# Patient Record
Sex: Male | Born: 1955 | Race: White | Hispanic: No | Marital: Married | Smoking: Former smoker
Health system: Southern US, Community
[De-identification: ages and names within clinical notes are randomized; demographics above are authoritative.]

## PROBLEM LIST (undated history)

## (undated) DIAGNOSIS — K219 Gastro-esophageal reflux disease without esophagitis: Secondary | ICD-10-CM

## (undated) DIAGNOSIS — IMO0001 Reserved for inherently not codable concepts without codable children: Secondary | ICD-10-CM

## (undated) DIAGNOSIS — E785 Hyperlipidemia, unspecified: Secondary | ICD-10-CM

## (undated) DIAGNOSIS — C801 Malignant (primary) neoplasm, unspecified: Secondary | ICD-10-CM

## (undated) DIAGNOSIS — M1990 Unspecified osteoarthritis, unspecified site: Secondary | ICD-10-CM

## (undated) DIAGNOSIS — J449 Chronic obstructive pulmonary disease, unspecified: Secondary | ICD-10-CM

## (undated) DIAGNOSIS — I499 Cardiac arrhythmia, unspecified: Secondary | ICD-10-CM

## (undated) HISTORY — DX: Hyperlipidemia, unspecified: E78.5

## (undated) HISTORY — PX: HIP SURGERY: SHX245

## (undated) HISTORY — PX: SHOULDER SURGERY: SHX246

## (undated) HISTORY — PX: TOTAL KNEE ARTHROPLASTY: SHX125

## (undated) HISTORY — PX: TONSILLECTOMY: SUR1361

---

## 2005-04-19 ENCOUNTER — Encounter
Admission: RE | Admit: 2005-04-19 | Discharge: 2005-07-18 | Payer: Self-pay | Admitting: Physical Medicine & Rehabilitation

## 2005-04-20 ENCOUNTER — Ambulatory Visit: Payer: Self-pay | Admitting: Physical Medicine & Rehabilitation

## 2005-06-16 ENCOUNTER — Ambulatory Visit: Payer: Self-pay | Admitting: Physical Medicine & Rehabilitation

## 2005-08-09 ENCOUNTER — Encounter
Admission: RE | Admit: 2005-08-09 | Discharge: 2005-11-07 | Payer: Self-pay | Admitting: Physical Medicine & Rehabilitation

## 2005-08-09 ENCOUNTER — Ambulatory Visit: Payer: Self-pay | Admitting: Physical Medicine & Rehabilitation

## 2005-10-12 ENCOUNTER — Ambulatory Visit: Payer: Self-pay | Admitting: Physical Medicine & Rehabilitation

## 2005-12-13 ENCOUNTER — Encounter
Admission: RE | Admit: 2005-12-13 | Discharge: 2006-03-13 | Payer: Self-pay | Admitting: Physical Medicine & Rehabilitation

## 2005-12-13 ENCOUNTER — Ambulatory Visit: Payer: Self-pay | Admitting: Physical Medicine & Rehabilitation

## 2006-03-03 ENCOUNTER — Ambulatory Visit: Payer: Self-pay | Admitting: Physical Medicine & Rehabilitation

## 2006-03-06 ENCOUNTER — Encounter
Admission: RE | Admit: 2006-03-06 | Discharge: 2006-06-04 | Payer: Self-pay | Admitting: Physical Medicine & Rehabilitation

## 2006-05-24 ENCOUNTER — Ambulatory Visit: Payer: Self-pay | Admitting: Physical Medicine & Rehabilitation

## 2006-08-14 ENCOUNTER — Ambulatory Visit: Payer: Self-pay | Admitting: Physical Medicine & Rehabilitation

## 2006-08-14 ENCOUNTER — Encounter
Admission: RE | Admit: 2006-08-14 | Discharge: 2006-11-12 | Payer: Self-pay | Admitting: Physical Medicine & Rehabilitation

## 2006-11-03 ENCOUNTER — Ambulatory Visit: Payer: Self-pay | Admitting: Physical Medicine & Rehabilitation

## 2007-01-24 ENCOUNTER — Ambulatory Visit: Payer: Self-pay | Admitting: Physical Medicine & Rehabilitation

## 2007-01-24 ENCOUNTER — Encounter
Admission: RE | Admit: 2007-01-24 | Discharge: 2007-01-30 | Payer: Self-pay | Admitting: Physical Medicine & Rehabilitation

## 2007-05-01 ENCOUNTER — Ambulatory Visit: Payer: Self-pay | Admitting: Physical Medicine & Rehabilitation

## 2007-05-01 ENCOUNTER — Encounter
Admission: RE | Admit: 2007-05-01 | Discharge: 2007-05-28 | Payer: Self-pay | Admitting: Physical Medicine & Rehabilitation

## 2007-08-16 ENCOUNTER — Encounter
Admission: RE | Admit: 2007-08-16 | Discharge: 2007-08-20 | Payer: Self-pay | Admitting: Physical Medicine & Rehabilitation

## 2007-08-20 ENCOUNTER — Ambulatory Visit: Payer: Self-pay | Admitting: Physical Medicine & Rehabilitation

## 2007-11-23 ENCOUNTER — Encounter
Admission: RE | Admit: 2007-11-23 | Discharge: 2007-11-26 | Payer: Self-pay | Admitting: Physical Medicine & Rehabilitation

## 2007-11-26 ENCOUNTER — Ambulatory Visit: Payer: Self-pay | Admitting: Physical Medicine & Rehabilitation

## 2008-02-04 ENCOUNTER — Encounter
Admission: RE | Admit: 2008-02-04 | Discharge: 2008-02-04 | Payer: Self-pay | Admitting: Physical Medicine & Rehabilitation

## 2008-02-04 ENCOUNTER — Ambulatory Visit: Payer: Self-pay | Admitting: Physical Medicine & Rehabilitation

## 2008-04-10 ENCOUNTER — Ambulatory Visit: Payer: Self-pay | Admitting: Physical Medicine & Rehabilitation

## 2008-04-10 ENCOUNTER — Encounter
Admission: RE | Admit: 2008-04-10 | Discharge: 2008-04-10 | Payer: Self-pay | Admitting: Physical Medicine & Rehabilitation

## 2008-05-01 ENCOUNTER — Encounter
Admission: RE | Admit: 2008-05-01 | Discharge: 2008-06-27 | Payer: Self-pay | Admitting: Physical Medicine & Rehabilitation

## 2008-05-02 ENCOUNTER — Ambulatory Visit: Payer: Self-pay | Admitting: Physical Medicine & Rehabilitation

## 2008-06-27 ENCOUNTER — Ambulatory Visit: Payer: Self-pay | Admitting: Physical Medicine & Rehabilitation

## 2008-08-13 ENCOUNTER — Encounter
Admission: RE | Admit: 2008-08-13 | Discharge: 2008-11-11 | Payer: Self-pay | Admitting: Physical Medicine & Rehabilitation

## 2008-08-22 ENCOUNTER — Ambulatory Visit: Payer: Self-pay | Admitting: Physical Medicine & Rehabilitation

## 2008-09-24 ENCOUNTER — Ambulatory Visit: Payer: Self-pay | Admitting: Physical Medicine & Rehabilitation

## 2008-10-24 ENCOUNTER — Ambulatory Visit: Payer: Self-pay | Admitting: Physical Medicine & Rehabilitation

## 2008-12-19 ENCOUNTER — Encounter
Admission: RE | Admit: 2008-12-19 | Discharge: 2009-02-25 | Payer: Self-pay | Admitting: Physical Medicine & Rehabilitation

## 2008-12-19 ENCOUNTER — Ambulatory Visit: Payer: Self-pay | Admitting: Physical Medicine & Rehabilitation

## 2009-02-13 ENCOUNTER — Ambulatory Visit: Payer: Self-pay | Admitting: Physical Medicine & Rehabilitation

## 2009-04-07 ENCOUNTER — Encounter
Admission: RE | Admit: 2009-04-07 | Discharge: 2009-06-10 | Payer: Self-pay | Admitting: Physical Medicine & Rehabilitation

## 2009-04-10 ENCOUNTER — Ambulatory Visit: Payer: Self-pay | Admitting: Physical Medicine & Rehabilitation

## 2009-06-11 ENCOUNTER — Encounter
Admission: RE | Admit: 2009-06-11 | Discharge: 2009-06-11 | Payer: Self-pay | Admitting: Physical Medicine & Rehabilitation

## 2009-06-11 ENCOUNTER — Ambulatory Visit: Payer: Self-pay | Admitting: Physical Medicine & Rehabilitation

## 2009-08-05 ENCOUNTER — Encounter
Admission: RE | Admit: 2009-08-05 | Discharge: 2009-11-03 | Payer: Self-pay | Admitting: Physical Medicine & Rehabilitation

## 2009-08-05 ENCOUNTER — Ambulatory Visit: Payer: Self-pay | Admitting: Physical Medicine & Rehabilitation

## 2009-09-18 ENCOUNTER — Ambulatory Visit: Payer: Self-pay | Admitting: Physical Medicine & Rehabilitation

## 2009-10-15 ENCOUNTER — Ambulatory Visit: Payer: Self-pay | Admitting: Physical Medicine & Rehabilitation

## 2009-11-19 ENCOUNTER — Encounter
Admission: RE | Admit: 2009-11-19 | Discharge: 2010-02-11 | Payer: Self-pay | Source: Home / Self Care | Attending: Physical Medicine & Rehabilitation | Admitting: Physical Medicine & Rehabilitation

## 2009-12-18 ENCOUNTER — Ambulatory Visit: Payer: Self-pay | Admitting: Physical Medicine & Rehabilitation

## 2010-02-11 ENCOUNTER — Encounter
Admission: RE | Admit: 2010-02-11 | Discharge: 2010-02-18 | Payer: Self-pay | Source: Home / Self Care | Attending: Physical Medicine & Rehabilitation | Admitting: Physical Medicine & Rehabilitation

## 2010-02-18 ENCOUNTER — Ambulatory Visit: Payer: Self-pay | Admitting: Physical Medicine & Rehabilitation

## 2010-04-14 ENCOUNTER — Encounter: Payer: Self-pay | Attending: Physical Medicine & Rehabilitation

## 2010-04-14 ENCOUNTER — Ambulatory Visit: Payer: Self-pay | Admitting: Physical Medicine & Rehabilitation

## 2010-04-14 DIAGNOSIS — G8929 Other chronic pain: Secondary | ICD-10-CM | POA: Insufficient documentation

## 2010-04-14 DIAGNOSIS — Z96649 Presence of unspecified artificial hip joint: Secondary | ICD-10-CM | POA: Insufficient documentation

## 2010-04-14 DIAGNOSIS — M1610 Unilateral primary osteoarthritis, unspecified hip: Secondary | ICD-10-CM

## 2010-04-14 DIAGNOSIS — M479 Spondylosis, unspecified: Secondary | ICD-10-CM | POA: Insufficient documentation

## 2010-04-14 DIAGNOSIS — M19019 Primary osteoarthritis, unspecified shoulder: Secondary | ICD-10-CM | POA: Insufficient documentation

## 2010-04-14 DIAGNOSIS — M47817 Spondylosis without myelopathy or radiculopathy, lumbosacral region: Secondary | ICD-10-CM

## 2010-04-14 DIAGNOSIS — M169 Osteoarthritis of hip, unspecified: Secondary | ICD-10-CM | POA: Insufficient documentation

## 2010-04-14 DIAGNOSIS — M1710 Unilateral primary osteoarthritis, unspecified knee: Secondary | ICD-10-CM

## 2010-06-03 ENCOUNTER — Encounter: Payer: Self-pay | Attending: Physical Medicine & Rehabilitation

## 2010-06-03 DIAGNOSIS — M1610 Unilateral primary osteoarthritis, unspecified hip: Secondary | ICD-10-CM | POA: Insufficient documentation

## 2010-06-03 DIAGNOSIS — M1710 Unilateral primary osteoarthritis, unspecified knee: Secondary | ICD-10-CM | POA: Insufficient documentation

## 2010-06-03 DIAGNOSIS — M545 Low back pain, unspecified: Secondary | ICD-10-CM | POA: Insufficient documentation

## 2010-06-03 DIAGNOSIS — M25569 Pain in unspecified knee: Secondary | ICD-10-CM | POA: Insufficient documentation

## 2010-06-03 DIAGNOSIS — M169 Osteoarthritis of hip, unspecified: Secondary | ICD-10-CM | POA: Insufficient documentation

## 2010-06-03 DIAGNOSIS — M25559 Pain in unspecified hip: Secondary | ICD-10-CM | POA: Insufficient documentation

## 2010-06-03 DIAGNOSIS — Z96649 Presence of unspecified artificial hip joint: Secondary | ICD-10-CM | POA: Insufficient documentation

## 2010-06-03 DIAGNOSIS — M47817 Spondylosis without myelopathy or radiculopathy, lumbosacral region: Secondary | ICD-10-CM

## 2010-06-03 DIAGNOSIS — Z79899 Other long term (current) drug therapy: Secondary | ICD-10-CM | POA: Insufficient documentation

## 2010-06-03 DIAGNOSIS — M19019 Primary osteoarthritis, unspecified shoulder: Secondary | ICD-10-CM | POA: Insufficient documentation

## 2010-07-13 NOTE — Assessment & Plan Note (Signed)
[name] returns to clinic today for followup evaluation.  Overall  he is doing well.  He does need a refill on several of his medicines in  the office today.  He is just running out today from last prescription  supplied.   REVIEW OF SYSTEMS:  Noncontributory.   MEDICATIONS:  1. Xanax 0.5 mg b.i.d. p.r.n. (approximately 2 per day).  2. Lortab 10/500, one tablet t.i.d. p.r.n.  3. Oxycodone continuous release 80 mg q.12h. (trade name).  4. Skelaxin 800 mg daily p.r.n.  5. Imitrex p.r.n. (rarely used).  6. Aspirin 81 mg daily.  7. Lipitor 40 mg daily.  8. Omeprazole 20 mg daily.  9. R-Tanna one tablet b.i.d. p.r.n.   PHYSICAL EXAM:  A well-appearing adult male in mild to no acute  discomfort.  Blood pressure is 141/74, the pulse is 97, respiratory rate  18 and O2 saturation 95% on room air.  He ambulates with a single point  can.  His bilateral upper and lower extremity strength are 5/5.   IMPRESSION:  Chronic left hip, low back and bilateral knee pain with  reported muscle fatigue and myalgia.   In the office today we did refill the patient's OxyContin continuous  release along with his R-Tanna, Skelaxin, Xanax and Lortab, each as of  today, November 06, 2006.  The patient continues to be compliant with  medication with good analgesic effect being delivered by his medicines.  Will plan on seeing the patient in followup in this office in  approximately 3 months' time with refills as necessary prior to that  appointment.           ______________________________  Ellwood Dense, M.D.     DC/MedQ  D:  11/06/2006 12:00:23  T:  11/06/2006 15:23:37  Job #:  [id number]

## 2010-07-13 NOTE — Assessment & Plan Note (Signed)
FOLLOWUP EVALUATION   SUBJECTIVE:  Scott Jefferson returns to clinic today for followup  evaluation.  He reports that he is getting fairly good relief with the  OxyContin.  He uses 80 mg twice a day.  It generally lasts 6 to 8 hours.  He uses his Lortab between the b.i.d. dosing on the OxyContin.  He does  need a refill on the Lortab along with his Xanax in the office today.  He plans to have an evaluation with Dr. Charlann Boxer, a local orthopedist, for  his right knee.  He also plans to discuss problems that he is having  with his right shoulder with the orthopedist when he sees him for the  first time.  The patient does request a refill on his R-Tanna.   REVIEW OF SYSTEMS:  Noncontributory.   MEDICATIONS:  1. Xanax 0.5 mg b.i.d. p.r.n.  2. Lortab 10/500 one tablet t.i.d. p.r.n.  3. Oxycodone continuous release 80 mg q 12 hours (trade name).  4. Skelaxin 800 mg q day p.r.n.  5. Imitrex p.r.n.  6. Aspirin 81 mgdaily.  7. Lipitor 40 mg daily.  8. Omeprazole 20 mg daily.  9. R-Tanna 1 tablet b.i.d. p.r.n.   PHYSICAL EXAMINATION:  GENERAL:  Well-appearing, middle-aged adult male  in mild, acute discomfort involving his right knee and right shoulder.  VITAL SIGNS:  Blood pressure is 142/75 with pulse 94,  respiratory rate  18 and O2 saturation 96 percent on room air.  EXTREMITIES: Range of motion was decreased in his right upper extremity,  especially proximally.  He has 4+/5 strength distally.  Lower extremity  exam showed 4+/5 strength throughout.  He ambulates with a single-point  cane.   IMPRESSION:  Chronic left hip, low back and bilateral knee pain with  reported muscle fatigue and myalgia.   In the office today, we did refill the patient's R-Tanna along with his  OxyContin, Xanax and Lortab, each as of February 01, 2007.  We will plan  on seeing the patient in followup in approximately 3 to 4 months' time  with refills prior to that appointment as necessary.     ______________________________  Ellwood Dense, M.D.     DC/MedQ  D:  01/29/2007 12:14:29  T:  01/29/2007 13:21:49  Job #:  [id number]

## 2010-07-13 NOTE — Assessment & Plan Note (Signed)
Scott Jefferson returns to clinic today for followup evaluation.  He  reports that he is doing well overall.  He continues to use his  OxyContin continuous release 80 mg twice a day along with his p.r.n.  Lortab and Xanax.  He also takes omeprazole 20 mg daily along with his  other medicines.   The patient is not using the Skelaxin to any significant degree at this  time and has a sufficient supply.   MEDICATIONS:  1. Xanax 0.5 mg b.i.d. p.r.n. (approximately 2 per day).  2. Lortab 10/500 one tablet t.i.d. p.r.n.  3. Oxycodone continuous release 80 mg q.12 h. (trade name).  4. Skelaxin 800 mg daily p.r.n.  5. Imitrex p.r.n. (rarely used).  6. Aspirin 81 mg daily.  7. Lipitor 40 mg daily.  8. Omeprazole 20 mg daily.   REVIEW OF SYSTEMS:  Noncontributory.   PHYSICAL EXAMINATION:  Well-appearing adult male in no acute discomfort.  Blood pressure 143/82 with a pulse of 93, respiratory rate 17 and O2  saturation 97% on room air.  He ambulates with a single-point cane.  His bilateral upper and lower extremity strength is 5/5.   IMPRESSION:  1. Chronic left hip, low back, and bilateral knee pain with reported      muscle fatigue and myalgia.  2. In the office today we did refill the patient's OxyContin      continuous release 80 mg q.12 h. along with his Lortab and Xanax.      We will plan on seeing the patient in followup in approximately 3      months' time with refill of medication prior to that date if      necessary.  The patient continues to get good analgesic effect      without signs of diversion or significant side effects.           ______________________________  Ellwood Dense, M.D.     DC/MedQ  D:  08/18/2006 15:10:02  T:  08/18/2006 19:59:55  Job #:  [id number]

## 2010-07-13 NOTE — Assessment & Plan Note (Signed)
Scott Jefferson returns to clinic today for followup evaluation.  He  reports today that he is getting good relief from his OxyContin, Lortab,  Xanax, and Skelaxin.  He does need refills on each of those over the  next few days.  He reports that he is under code red at the present  time.  His wife is recovering from a back surgery, and he is hopeful  that she will be able to return to work and then cover him with her  insurance.  In the meantime, he is at risk of having no insurance after  January 2010.  We will try to adjust the medications  for him as best  possible in the office today.   REVIEW OF SYSTEMS:  Noncontributory.   MEDICATIONS:  1. Xanax 0.5 mg b.i.d. p.r.n.  2. Lortab 10/500 1 tablet t.i.d. p.r.n.  3. OxyContin 10 continuous release 80 mg t.i.d. (trade name).  4. Skelaxin 800 mg q. day. p.r.n.  5. Imitrex p.r.n.  6. Aspirin 81 mg q. day.  7. Lipitor 40 mg q. day.  8. Omeprazole 20 mg q. day.  9. R-Tanna 1 tablet b.i.d. p.r.n.   PHYSICAL EXAMINATION:  GENERAL:  Well-appearing, middle-aged adult male  in not acute discomfort.  VITAL SIGNS:  Blood pressure 147/70 with pulse of 94, respiratory rate  18, and O2 saturation at 96% on room air.  He has 4+/5 strength  throughout the bilateral upper and lower extremities.  He ambulates with  a single-point gait.   IMPRESSION:  1. Chronic left hip, low back, and bilateral knee pain with reported      muscle fatigue and myalgia.  2. In the office today, we did refill the patient's Xanax, Lortab,      OxyContin, and Skelaxin each as of February 11, 2008.  3. We plan on seeing the patient in followup in this office in      approximately 4-5 months' time with refills prior to that      appointment as covered by his insurance in the future.           ______________________________  Ellwood Dense, M.D.     DC/MedQ  D:  02/04/2008 13:10:08  T:  02/05/2008 07:30:21  Job #:  [id number]

## 2010-07-13 NOTE — Assessment & Plan Note (Signed)
Scott Jefferson returns to clinic today for followup evaluation.  During  the last clinic visit, May 28, 2007, we had increased his OxyContin  continuous release to 80 mg 3 times per day.  He reports that he is  getting much better relief with that present dosage.  He also has Lortab  available on an as needed basis 3 times a day.  He needs a refill on the  OxyContin and Lortab along with the Xanax, but has a sufficient supply  of Skelaxin.   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   REVIEW OF SYSTEMS:  Noncontributory.   MEDICATIONS:  1. Xanax 0.5 mg b.i.d. p.r.n.  2. Lortab 10/500 one tablet t.i.d. p.r.n.  3. OxyContin continuous release 80 mg t.i.d. (trade name).  4. Skelaxin 800 mg daily p.r.n.  5. Imitrex p.r.n.  6. Aspirin 81 mg.  7. Lipitor 40 mg.  8. Omeprazole 20 mg.  9. R-Tanna 1 tablet b.i.d. p.r.n.   PHYSICAL EXAMINATION:  GENERAL:  Well appearing middle-aged adult male,  in mild to no acute discomfort.  VITAL SIGNS:  Blood pressure 131/81 with a pulse of 82, respiratory rate  18, and O2 saturation 95% on room air.  EXTREMITIES:  He ambulates with a single point cane.  He has 4+/5  strength throughout the bilateral lower extremities and 4+/5 strength  throughout the bilateral upper extremities.   IMPRESSION:  Chronic left hip, low back, and bilateral knee pain with  reported muscle fatigue and myalgia.   In the office today, we did refill the patient's Xanax along with his  OxyContin continuous release and Lortab.  He continues to take  medications as prescribed without adverse side effects or signs of  diversion.  We will plan on seeing him in followup in approximately 3  months' time.           ______________________________  Ellwood Dense, M.D.     DC/MedQ  D:  08/20/2007  14:40:05  T:  08/21/2007 04:54:09  Job #:  [id number]

## 2010-07-13 NOTE — Assessment & Plan Note (Signed)
Mr. Reihl returns to clinic today for followup evaluation.  He has  been using his OxyContin CR 80 mg twice a day.  He generally uses the  first tablet in the morning and the second tablet approximately 2  o'clock in the afternoon.  That gives him better relief through the  first part of the day but he reports that he suffers through the latter  portion of the day as he has already used up his 2 tablets.  He does use  the Lortab approximately 3 per day and tries to use that later in the  day but still gets inadequate relief.  When he does use his OxyContin in  the morning and the early afternoon, he does get good relief.  He tries  to remain active and is raising a small garden.  He does need refills on  numerous medicines in the office today.  He is using the Skelaxin only  on an as-needed basis and does not need a refill on that medication.   REVIEW OF SYSTEMS:  Noncontributory.   MEDICATIONS:  1. Xanax 0.5 mg 1 tablet b.i.d. p.r.n.  2. Lortab 10/500 one tablet t.i.d. p.r.n.  3. OxyContin continuous release 80 mg every 12 hours (trade name).  4. Skelaxin 800 mg daily p.r.n.  5. Imitrex p.r.n.  6. Aspirin 81 mg daily.  7. Lipitor 40 mg daily.  8. Omeprazole 20 mg daily.  9. R-Tanna 1 tablet b.i.d. p.r.n.   PHYSICAL EXAM:  Well-appearing, middle-aged adult male in mild acute  discomfort.  Blood pressure 158/85 with a pulse of 103, respiratory rate  18, and O2 saturation 96% on room air.  He has 4+/5 strength throughout  the lower extremities.  He ambulates with a single-point cane.  Upper  extremity strength was 4+/5.   IMPRESSION:  Chronic left hip, low back, and bilateral knee pain with  reported muscle fatigue and myalgia.   In the office today, we did adjust the patient's OxyContin continuous  release to 80 mg 3 times a day to give him better relief throughout the  latter part of the day.  We also refilled his Xanax along with this  Lortab in the office today.  The  OxyContin was trade name necessary.  We  will plan on seeing the patient in followup in approximately 3 months  time with refills prior to that appointment as necessary.           ______________________________  Ellwood Dense, M.D.     DC/MedQ  D:  05/28/2007 15:00:31  T:  05/28/2007 15:41:58  Job #:  [id number]

## 2010-07-13 NOTE — Assessment & Plan Note (Signed)
HISTORY OF PRESENT ILLNESS:  Scott Jefferson is back regarding his chronic  arthritides.  He has received the new DonJoy  brace which seems to be  working for him and not slipping.  It provides better support and  offloading of his knee.  He rates his pain 4-5/10.  He describes his  pain is constant.  Pain interferes with general activity, relations with  others, enjoyment of life on a moderate level.  Sleep is fair.   REVIEW OF SYSTEMS:  Notable for the above.  Full-14 point review is in  the written health and history section of the chart.   SOCIAL HISTORY:  Noted above.  He is living in [facility].   PHYSICAL EXAMINATION:  VITAL SIGNS:  Blood pressure is 152/82, pulse is  102, respiratory rate 80, he is sating 98% on room air.  GENERAL:  The patient is pleasant, alert, and oriented x3.  EXTREMITIES:  He continues to walk with antalgia, but seems to be  bearing weight a bit better on the left side today.  His strength is 4/5  at the knees with inhibition.  Other strength at the hips, feet, upper  extremities is 4+-5/5.  He has more pain with flexion than extension on  the back movement today.  HEART:  Regular rhythm slightly tachycardiac.  CHEST:  Clear.  ABDOMEN:  Soft, nontender.   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   PLAN:  1. We will refill the OxyContin 80 mg q.8 h as he is fairly stable      with this #90.  Also, Lortab 10/500 q.8 h p.r.n.  He is given a      second refill for next month.  2. I will see him back in 2 months for a followup.      Ranelle Oyster, M.D.  Electronically Signed     ZTS/MedQ  D:  10/24/2008 11:11:58  T:  10/25/2008 02:31:07  Job #:  [id number]

## 2010-07-13 NOTE — Assessment & Plan Note (Signed)
Mr. Edgerly is back regarding his multiple arthritides.  The patient  has been generally stable with his knee pain.  He brought his braces  with him today and most of them are in disrepair.  He has a larger  DonJoy brace which seemed to be somewhat functional.  His pain remains 4-  5/10.  Pain is constant.  Sleep is fair.  His back has been acting up a  bit more lately.   REVIEW OF SYSTEMS:  Notable for the above.  Full 14-point review is in  the written health and history section of the chart.   SOCIAL HISTORY:  Unchanged.  He still lives in [place].   PHYSICAL EXAMINATION:  VITAL SIGNS:  Blood pressure is 154/82, pulse is  102, respiratory rate 18, sating 96% on room air.  GENERAL:  The patient is pleasant, alert and oriented x3.  EXTREMITIES:  He has some antalgia, more on the left than right knee  today.  Knee strength is generally 4+/5 with pain inhibition and  weakness.  He had pain with flexion of the hips more than extension.  BACK:  Somewhat tender to range of motion especially with flexion  activity.  HEART:  Regular.  CHEST:  Clear.  ABDOMEN:  Soft, nontender.   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   PLAN:  1. Continue with OxyContin 80 mg q.8 h, #90; Lortab 10/500 q.8 h      p.r.n.  I gave him a second month of refills.  2. We will send him to the orthotist to assess him for replacement      parts for his knee braces.  3. We will see him back with nursing in 2 months and with me in 4      months' time.      Ranelle Oyster, M.D.  Electronically Signed     ZTS/MedQ  D:  06/27/2008 13:32:55  T:  06/28/2008 03:11:07  Job #:  [id number]

## 2010-07-13 NOTE — Assessment & Plan Note (Signed)
[name] returns to clinic today for followup evaluation.  He  reports that overall he is doing well on his OxyContin continues release  use 3 times per day and his Lortab use approximately 3 times per day.  He does need a refill on his Skelaxin but has sufficient supply of the  other 2 medications.   REVIEW OF SYSTEMS:  Noncontributory.   MEDICATIONS:  1. Xanax 0.5 mg b.i.d. p.r.n.  2. Lortab 10/500 one tablet t.i.d. p.r.n.  3. OxyContin continuous release 80 mg t.i.d. (trade name).  4. Skelaxin 800 mg daily p.r.n.  5. Imitrex p.r.n.  6. Aspirin 81 mg daily.  7. Lipitor 40 mg daily.  8. Omeprazole 20 mg daily.  9. R-Tanna 1 tablet b.i.d. p.r.n.   PHYSICAL EXAMINATION:  GENERAL:  Well-appearing, middle-aged adult male  in mild-to-no acute discomfort.  VITAL SIGNS:  Blood pressure 137/67 with a pulse of 104, respiratory  rate 18, and O2 saturation 97% on room air.  She has 4+/5 strength  throughout the bilateral upper and lower extremities.   IMPRESSION:  Chronic left hip, low back, and bilateral knee pain with  reported muscle fatigue and myalgia.   In the office today, no refill on his Lortab or OxyContin is necessary.  We did refill his Skelaxin at 800 mg daily p.r.n. a total of 30 with 3  refills.  We will plan on seeing the patient in followup in  approximately early December as he has plan to have the insurance change  towards the end of the year.           ______________________________  Ellwood Dense, M.D.     DC/MedQ  D:  11/26/2007 15:04:51  T:  11/27/2007 04:58:59  Job #:  [id number]

## 2010-07-13 NOTE — Assessment & Plan Note (Signed)
Scott Jefferson is back regarding his multiple arthridites.  He has been  seen by Dr. Thomasena Edis for some time with pain management.  The patient has  been on OxyContin 80 mg t.i.d. with Lortab 10/500 one t.i.d. p.r.n..  He  has had 2 hip replacement surgeries.  Left one apparently still gives  him a big problem, and his right hip is doing fairly well.  He has had  recent arthroscopic surgery by Dr. Ranell Patrick.  He has stone recovery phase  there.  He has severe knee pain and osteoarthritis and needs a knee  replacement, but is hesitant to pursue these due to his prior  experiences with the hips.  He rates his pain a 4-5/10 described as  constant.  Pain interferes with general activity, relationship with  others, enjoyment of life on a mild-to-moderate level.  Sleep remains  poor.   REVIEW OF SYSTEMS:  Notable for the above.  He denies any new issues and  full review is in the health and history section of the chart.  The  patient's Oswestry score 60% today.   SOCIAL HISTORY:  Unchanged today.  He remains disabled and is applying  for long-term disability.   PHYSICAL EXAMINATION:  VITAL SIGNS:  Blood pressure is 151/75, pulses  are 110, respiratory rate 18, he is sating 96% on room air.  GENERAL:  The patient is generally alert and pleasant.  He walks with  antalgia on either legs with significant valgus deformities at each  knee.  He has more antalgia on the left and right.  Knees are strong at  4+ 5/5 with some pain inhibition noted.  He has pain in hips with  flexion more than extension today.  He uses a straight cane for balance.  There are no focal sensory issues in either legs and reflexes are 1+.  Right shoulder is limited with range of motion and he is sensitive to  move it due to his postsurgical precautions at this point.  SKIN:  Generally intact, warm with good color and pulses today.  Cognitively, he is appropriate.  CRANIAL NERVE:  Normal.  HEART:  Remains tachycardic.  CHEST:   Clear.  ABDOMEN:  Soft, nontender.   ASSESSMENT:  Chronic degenerative joint disease involving the right  shoulder, both hips and bilateral knees setting of low back pain.   PLAN:  1. We will continue with his current regimen for now of OxyContin 80      mg q.8 h with Lortab 10/500 one q.8 h p.r.n.  UDS was checked last      month, which was essentially consistent.  2. I think he would do well with bilateral knee replacements.  I urged      him to discuss this further with Dr. Charlann Boxer who has been in contact      with.  I believe he has an appointment there next month.  I did      give the patient a second fill of his narcotics as he is suffering      with financial hardship at this point and lives in the [place] area      and has quite a drive here.  I will see him back in about 2 months'      time for followup.      Ranelle Oyster, M.D.  Electronically Signed     ZTS/MedQ  D:  05/02/2008 13:55:08  T:  05/03/2008 05:43:57  Job #:  [id number]

## 2010-07-16 NOTE — Group Therapy Note (Signed)
MEDICAL RECORD NUMBER:  [id number].   REFERRING PHYSICIAN:  Dr. Ane Payment, [facility] [place], [place].   PURPOSE OF EVALUATION:  Evaluate and treat chronic back and hip/knee pain.   HISTORY OF PRESENT ILLNESS:  [name] is a 55 year old adult male  referred to this office by Dr. Jearld Shines office for chronic pain management.  Apparently, Dr. Ane Payment has some problems with his medical license, and he is  not seeing patient's at this time. The patient had been receiving his pain  medicines from Dr. Ane Payment, but that is no longer possible, and he was  referred here for continuation of those medicines.   The patient reports that he has had chronic pain of his hips, initially  starting on the left side when he had a left total hip replacement in 2002.  He was on pain medicines after that initial left hip replacement and then  subsequently went on to have a right total hip replacement in 2004. He  reports that during the 2004 surgery at [facility] he was later informed  that they had washed the instruments for the hardware with some type of  hydraulic fluid reportedly from an elevator shaft. The patient has tried to  look into what effects that may have had, but it is unsure exactly what  effect it may have had at this point. He has had severe muscle ache along  with pain, mostly of his left hip at this time although the incident with  hydraulic fluid involved his right hip surgery. He reports pain all over his  body, especially in his low back. He has a history of bilateral knee pain  and wears braces on his knees at the present time. He also reports a vague  clenching pain of his hand when he first wakes up in the morning although  that seems to ease. He reports that he has had a history of low back pain  although there is no documentation of any studies as best I can tell.   The patient has been getting his OxyContin and hydrocodone medications from  Dr. Ane Payment in [place].  Apparently, he has had no medicines since the end of  December to early January and has tried to use the medicine as sparingly as  possible to prolong the course of treatment. He has now been off his  OxyContin for several weeks and experienced flu-like symptoms when he came  off of that. He had been taking the hydrocodone until he ran out a few days  ago. He also takes Xanax and needs a refill on that as that was also filled  by Dr. Ane Payment.   The patient apparently had seen a rheumatologist by Dr. Francine Graven in July of  2005. Dr. Francine Graven could not find any evidence of rheumatoid disease. He had  done numerous blood tests including TSH, rheumatoid factor and antinuclear  antibiotics which were also within normal limits. His complex metabolic  panel only came back for an elevated glucose although it is unclear if that  was a fasting level. All other blood work was unremarkable. Dr. Francine Graven  could not come up on any known problems related to the hydraulic fluid.   PAST MEDICAL HISTORY:  1.  Gastroesophageal reflux disease.  2.  History of migraines.  3.  Dyslipidemia.  4.  Bilateral knee osteoarthritis with braces and prior meniscectomy of the      right knee.  5.  Irritable bowel syndrome with questionable COPD.  6.  History  of panic attacks.   ALLERGIES:  No known drug allergies.   FAMILY HISTORY:  Parents are still living and are cancer survivors.   SOCIAL HISTORY:  The patient is married with no children. He has a high  school education and was in the [employer] for a period of time in the early  70s. He has been on disability payments since 2001 and previously worked for  the railroad. He admits to occasional beer use, but he reports that he quit  smoking greater than 10 years ago. He had smoked 20 years, 2 packs per day  prior to quitting.   PHYSICAL EXAMINATION:  GENERAL:  Reasonably fit adult male seated in a  regular chair. He has braces on his bilateral knees and uses a  single-point  cane for ambulation. Height was 6 foot, weight 200 pounds.  VITAL SIGNS:  Blood pressure 133/75 with a pulse of 108, respiratory rate 16  and O2 saturation 96% on room air.   The patient ambulates with an antalgic gait especially on the right side.  Lumbar range of motion was decreased in all planes in the stand position.  Upper extremity range of motion was within functional limits. Upper  extremity exam showed 5/5 strength throughout. Bulk and tone were normal,  and reflexes were 2+ and symmetrical. Sensation was intact to light touch  throughout the bilateral upper extremities.   Lower extremity exam showed braces in place on his bilateral knees. Strength  in his lower extremities was 4+/5. Sensation was intact to light touch  throughout his bilateral lower extremities, and reflexes were 2+ and  symmetrical.   MEDICATIONS:  1.  Xanax 0.5 mg b.i.d. p.r.n.  2.  Lortab 10/500 one tablet t.i.d. p.r.n.  3.  Advair 100/50 daily.  4.  Lipitor 40 mg daily.  5.  Aciphex 20 mg daily.  6.  Aspirin 81 mg daily.  7.  Imitrex p.r.n.  8.  __________ p.r.n.  9.  Ketoconazole lotion 2% p.r.n.  10. Skelaxin 800 mg daily p.r.n.  11. OxyContin SR 80 mg q.12h.   IMPRESSION:  1.  Chronic left hip, low back and bilateral knee pain.  2.  Reported muscle fatigue and myalgias, diffuse.   At the present time, the patient had been gaining good relief with the pain  medicines used by Dr. Ane Payment over the past several months to years.  Unfortunately, Dr. Ane Payment is not available to fill those. We are asked to  fill in at this time. The patient had been getting good relief and was  actually able to plant a garden last year. He has had a significant decline  since he has been off of his pain medicines with a flare of his left back,  left hip, and bilateral knee pain.   We have refilled both the Xanax along with his hydrocodone and his OxyContin in the office today. He understands that he  needs to comply with all of our  regulations and call in for refills at least five business days prior to  refill being needed. He understands he needs to take the medication only as  prescribed and be compliant with all rules. We will plan on seeing the  patient in followup in this office in approximately two months' time with  refill of medication at the one month period.           ______________________________  Ellwood Dense, M.D.     DC/MedQ  D:  04/20/2005 13:24:31  T:  04/21/2005 10:19:05  Job #:  [id number]

## 2010-07-28 ENCOUNTER — Encounter: Payer: Self-pay | Attending: Physical Medicine & Rehabilitation | Admitting: Physical Medicine & Rehabilitation

## 2010-07-28 DIAGNOSIS — M25569 Pain in unspecified knee: Secondary | ICD-10-CM | POA: Insufficient documentation

## 2010-07-28 DIAGNOSIS — Z79899 Other long term (current) drug therapy: Secondary | ICD-10-CM | POA: Insufficient documentation

## 2010-07-28 DIAGNOSIS — M1610 Unilateral primary osteoarthritis, unspecified hip: Secondary | ICD-10-CM | POA: Insufficient documentation

## 2010-07-28 DIAGNOSIS — M47817 Spondylosis without myelopathy or radiculopathy, lumbosacral region: Secondary | ICD-10-CM

## 2010-07-28 DIAGNOSIS — M1710 Unilateral primary osteoarthritis, unspecified knee: Secondary | ICD-10-CM

## 2010-07-28 DIAGNOSIS — M479 Spondylosis, unspecified: Secondary | ICD-10-CM | POA: Insufficient documentation

## 2010-07-28 DIAGNOSIS — M19019 Primary osteoarthritis, unspecified shoulder: Secondary | ICD-10-CM | POA: Insufficient documentation

## 2010-07-28 DIAGNOSIS — M25559 Pain in unspecified hip: Secondary | ICD-10-CM | POA: Insufficient documentation

## 2010-07-28 DIAGNOSIS — M169 Osteoarthritis of hip, unspecified: Secondary | ICD-10-CM | POA: Insufficient documentation

## 2010-07-28 DIAGNOSIS — Z96649 Presence of unspecified artificial hip joint: Secondary | ICD-10-CM | POA: Insufficient documentation

## 2010-07-29 NOTE — Assessment & Plan Note (Signed)
Scott Jefferson is back regarding his chronic knee and hip pain.  It generally has been fairly stable.  He is suffering from a recent GI infection but otherwise doing well.  He is managing with methadone and hydrocodone as dosed.  REVIEW OF SYSTEMS:  Notable for the above.  Full 12-point review is in the written health and history section of the chart.  SOCIAL HISTORY:  The patient lives with his wife in [place] and no changes noted.  PHYSICAL EXAMINATION:  VITAL SIGNS:  Blood pressure is 155/74, pulse is 94, respiratory rate 18, and he is saturating 97% on room air. GENERAL:  The patient is pleasant, alert, and oriented x3. MUSCULOSKELETAL:  He has antalgia on the right more than left lower extremity again.  He favors bilateral knees, especially in stance phase. Strength remains 4-5/5 with pain inhibition in both legs noted. HEART:  Regular. CHEST:  Clear. ABDOMEN:  Soft and nontender.  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  PLAN: 1. We will continue methadone and Lortab which were refilled today. 2. We will order for EKG to assess QTc.  He will have this done at     [facility] or [facility]. 3. He will see my nurse practitioner back in 2 months.     Ranelle Oyster, M.D. Electronically Signed    ZTS/MedQ D:  07/28/2010 14:00:10  T:  07/29/2010 01:04:12  Job #:  [id number]

## 2010-09-16 ENCOUNTER — Ambulatory Visit (HOSPITAL_COMMUNITY)
Admission: RE | Admit: 2010-09-16 | Discharge: 2010-09-16 | Disposition: A | Discharge status: Home / Self Care | Payer: Self-pay | Source: Ambulatory Visit | Attending: Psychology | Admitting: Psychology

## 2010-09-16 DIAGNOSIS — I4510 Unspecified right bundle-branch block: Secondary | ICD-10-CM | POA: Insufficient documentation

## 2010-09-22 ENCOUNTER — Encounter: Payer: Self-pay | Attending: Physical Medicine & Rehabilitation | Admitting: Neurosurgery

## 2010-09-22 DIAGNOSIS — M19019 Primary osteoarthritis, unspecified shoulder: Secondary | ICD-10-CM | POA: Insufficient documentation

## 2010-09-22 DIAGNOSIS — M1710 Unilateral primary osteoarthritis, unspecified knee: Secondary | ICD-10-CM | POA: Insufficient documentation

## 2010-09-22 DIAGNOSIS — M25569 Pain in unspecified knee: Secondary | ICD-10-CM | POA: Insufficient documentation

## 2010-09-22 DIAGNOSIS — M169 Osteoarthritis of hip, unspecified: Secondary | ICD-10-CM | POA: Insufficient documentation

## 2010-09-22 DIAGNOSIS — Z79899 Other long term (current) drug therapy: Secondary | ICD-10-CM | POA: Insufficient documentation

## 2010-09-22 DIAGNOSIS — M1610 Unilateral primary osteoarthritis, unspecified hip: Secondary | ICD-10-CM | POA: Insufficient documentation

## 2010-09-22 DIAGNOSIS — M25559 Pain in unspecified hip: Secondary | ICD-10-CM | POA: Insufficient documentation

## 2010-09-22 DIAGNOSIS — Z96649 Presence of unspecified artificial hip joint: Secondary | ICD-10-CM | POA: Insufficient documentation

## 2010-09-22 DIAGNOSIS — M25529 Pain in unspecified elbow: Secondary | ICD-10-CM

## 2010-09-22 DIAGNOSIS — M479 Spondylosis, unspecified: Secondary | ICD-10-CM | POA: Insufficient documentation

## 2010-09-22 NOTE — Assessment & Plan Note (Signed)
[name] is a patient of Dr. Riley Kill, seen for his chronic knee and hip pain.  Right now, he reports no changes in his condition.  He is seen every 2 months per Dr. Riley Kill.  He reports his pain is 4/5, it is fluctuating, but a constant type pain.  His general activity level is 0- 5.  Sleep patterns are fair.  Pain is worse at night; walking, bending tend to aggravate; rest, heat, and medication tend to help.  He uses a cane for ambulation.  He does limp.  He can walk about 5/10 at a time. He climb steps and drive.  He is on disability.  REVIEW OF SYSTEMS:  Notable for difficulties described above, otherwise within normal limits.  His Oswestry score is 58, no signs of aberrant behaviors.  PAST MEDICAL HISTORY:  Unchanged.  SOCIAL HISTORY:  He is married, lives with his wife in [place].  FAMILY HISTORY:  Unchanged.  PHYSICAL EXAMINATION:  VITAL SIGNS:  His blood pressure is 158/78, pulse 90, respirations 16, O2 sats 97 on room air. MUSCULOSKELETAL:  His motor strength is about 4/5 in the lower extremities.  Sensation is intact. CONSTITUTION:  He is within normal limits. NEUROLOGIC:  He is alert and oriented x3.  He does have a significant limp with his cane.  ASSESSMENT: 1. Degenerative joint disease, chronic in the right shoulder although     he reports his pain is being better. 2. Status post bilateral hip arthroplasties, chronic pain. 3. Knee and back chronic pain.  PLAN: 1. Refill methadone 10 mg 2 p.o. t.i.d., 180 was given, dual     prescriptions for this month and next month. 2. Lortab 10/500 one p.o. q.8 hours p.r.n., #90 with no refill, again     he has dual prescriptions.  He will follow up in my clinic in 1     month.  His questions were encouraged and answered.     Scott Jefferson Electronically Signed    RLW/MedQ D:  09/22/2010 13:07:03  T:  09/22/2010 21:00:34  Job #:  [id number]

## 2010-11-12 ENCOUNTER — Encounter: Payer: Self-pay | Attending: Physical Medicine & Rehabilitation | Admitting: Neurosurgery

## 2010-11-12 DIAGNOSIS — M25569 Pain in unspecified knee: Secondary | ICD-10-CM | POA: Insufficient documentation

## 2010-11-12 DIAGNOSIS — M19019 Primary osteoarthritis, unspecified shoulder: Secondary | ICD-10-CM | POA: Insufficient documentation

## 2010-11-12 DIAGNOSIS — M545 Low back pain, unspecified: Secondary | ICD-10-CM

## 2010-11-12 DIAGNOSIS — Z96649 Presence of unspecified artificial hip joint: Secondary | ICD-10-CM | POA: Insufficient documentation

## 2010-11-12 DIAGNOSIS — M25559 Pain in unspecified hip: Secondary | ICD-10-CM | POA: Insufficient documentation

## 2010-11-12 DIAGNOSIS — Z79899 Other long term (current) drug therapy: Secondary | ICD-10-CM | POA: Insufficient documentation

## 2010-11-12 DIAGNOSIS — M1610 Unilateral primary osteoarthritis, unspecified hip: Secondary | ICD-10-CM | POA: Insufficient documentation

## 2010-11-12 DIAGNOSIS — M25529 Pain in unspecified elbow: Secondary | ICD-10-CM

## 2010-11-12 DIAGNOSIS — M1710 Unilateral primary osteoarthritis, unspecified knee: Secondary | ICD-10-CM

## 2010-11-12 DIAGNOSIS — M479 Spondylosis, unspecified: Secondary | ICD-10-CM | POA: Insufficient documentation

## 2010-11-12 DIAGNOSIS — M169 Osteoarthritis of hip, unspecified: Secondary | ICD-10-CM | POA: Insufficient documentation

## 2010-11-12 NOTE — Assessment & Plan Note (Signed)
[name] is a patient of Dr. Riley Kill, seen for his chronic knee and hip pain.  He reports no changes to his pain.  He is seen here in the clinic every 2 months and doing okay with his medicines.  Average pain is 3/5, it is constant.  General activity level is 0-2/4.  Pain is worse at night.  Sleep patterns are fair.  Pain is worse with walking, bending; rest, heat, and medication tend to help.  He uses a cane to ambulate.  He can climb steps.  He can drive.  He can walk about 10 minutes at a time.  He is on disability.  REVIEW OF SYSTEMS:  Notable for difficulties as described above, otherwise within normal limits.  His Oswestry score is 76.  Past medical history, social history, and family history unchanged.  PHYSICAL EXAMINATION:  VITAL SIGNS:  His blood pressure is 143/76, pulse 86, respirations 16, O2 sats 96 on room air. MUSCULOSKELETAL:  His motor strength is 5/5 with confrontation testing in the upper and lower extremities.  Sensation is intact. NEUROLOGIC:  Constitutionally, he is within normal limits.  She is alert and oriented x3.  ASSESSMENT: 1. Degenerative joint disease, right shoulder. 2. Bilateral hip arthroplasties, chronic pain. 3. Knee and back chronic pain.  PLAN: 1. Refill methadone 10 mg 2 p.o. t.i.d., 180, two prescriptions, one     for this month, one for next month; Lortab 10/500 one p.o. q.8     hours p.r.n., 90 with no refill. 2. He was Xanax 0.5 mg 1 p.o. b.i.d. p.r.n. 60 with refill.  His     questions were encouraged and answered.  I will see him back in 2     months     Scott Jefferson Electronically Signed    RLW/MedQ D:  11/12/2010 13:08:43  T:  11/12/2010 23:27:38  Job #:  [id number]

## 2011-01-06 ENCOUNTER — Encounter: Payer: Self-pay | Attending: Neurosurgery | Admitting: Neurosurgery

## 2011-01-06 DIAGNOSIS — M25519 Pain in unspecified shoulder: Secondary | ICD-10-CM | POA: Insufficient documentation

## 2011-01-06 DIAGNOSIS — M545 Low back pain, unspecified: Secondary | ICD-10-CM | POA: Insufficient documentation

## 2011-01-06 DIAGNOSIS — M25529 Pain in unspecified elbow: Secondary | ICD-10-CM

## 2011-01-06 DIAGNOSIS — G8929 Other chronic pain: Secondary | ICD-10-CM | POA: Insufficient documentation

## 2011-01-06 DIAGNOSIS — Z96619 Presence of unspecified artificial shoulder joint: Secondary | ICD-10-CM | POA: Insufficient documentation

## 2011-01-06 DIAGNOSIS — M19019 Primary osteoarthritis, unspecified shoulder: Secondary | ICD-10-CM | POA: Insufficient documentation

## 2011-01-06 DIAGNOSIS — M79609 Pain in unspecified limb: Secondary | ICD-10-CM | POA: Insufficient documentation

## 2011-01-06 DIAGNOSIS — M1710 Unilateral primary osteoarthritis, unspecified knee: Secondary | ICD-10-CM

## 2011-01-06 DIAGNOSIS — M25559 Pain in unspecified hip: Secondary | ICD-10-CM

## 2011-01-06 NOTE — Assessment & Plan Note (Signed)
This patient of Dr. Riley Kill seen for chronic knee and foot as well as low back pain with right shoulder pain.  He reports no change in his pain, it is 4-5.  It is constant pain.  General activity level is 0-4.  Pain is worse at night.  Sleep patterns are fair.  Pain is worse with walking and bending.  Medication tends to help.  He uses a cane at times for ambulation.  He climb steps and drive.  He can walk about 10 minutes at a time.  Functionally, he is on disability.  REVIEW OF SYSTEMS:  Notable for difficulties described above, otherwise within normal limits.  PAST MEDICAL HISTORY:  Unchanged.  SOCIAL HISTORY:  Unchanged.  FAMILY HISTORY:  Unchanged.  PHYSICAL EXAMINATION:  Blood pressure 152/82, pulse 68, respirations 16, O2 sats 97% on room air.  His motor strength and sensation are intact. Constitutionally, he is within normal limits.  He is alert and oriented x3.  He has slight limp to his gait.  ASSESSMENT: 1. Degenerative joint disease, right shoulder. 2. Bilateral hip arthroplasties and chronic pain. 3. Knee and back chronic pain.  PLAN: 1. Refilled Xanax 0.5, 1 p.o. b.i.d., 60 with 2 refills. 2. Lortab 10/500 one p.o. q.8 h. p.r.n., 90 with no refill.  He was     given 2 prescriptions for that. 3. Methadone 10 mg 3 times a day, 180 with no refill.  He was given 2     prescriptions for that. 4. We will see him back here in 2 months.  His questions were     encouraged and answered.     Scott Jefferson L. Blima Dessert Electronically Signed    RLW/MedQ D:  01/06/2011 13:16:21  T:  01/06/2011 13:49:19  Job #:  [id number]

## 2011-03-08 ENCOUNTER — Encounter: Payer: Self-pay | Attending: Neurosurgery | Admitting: Neurosurgery

## 2011-03-08 DIAGNOSIS — M549 Dorsalgia, unspecified: Secondary | ICD-10-CM | POA: Insufficient documentation

## 2011-03-08 DIAGNOSIS — M545 Low back pain: Secondary | ICD-10-CM

## 2011-03-08 DIAGNOSIS — M25519 Pain in unspecified shoulder: Secondary | ICD-10-CM | POA: Insufficient documentation

## 2011-03-08 DIAGNOSIS — Z96649 Presence of unspecified artificial hip joint: Secondary | ICD-10-CM | POA: Insufficient documentation

## 2011-03-08 DIAGNOSIS — M19019 Primary osteoarthritis, unspecified shoulder: Secondary | ICD-10-CM | POA: Insufficient documentation

## 2011-03-08 DIAGNOSIS — M25569 Pain in unspecified knee: Secondary | ICD-10-CM

## 2011-03-08 DIAGNOSIS — M79609 Pain in unspecified limb: Secondary | ICD-10-CM | POA: Insufficient documentation

## 2011-03-08 DIAGNOSIS — G894 Chronic pain syndrome: Secondary | ICD-10-CM

## 2011-03-08 DIAGNOSIS — M25559 Pain in unspecified hip: Secondary | ICD-10-CM | POA: Insufficient documentation

## 2011-03-08 DIAGNOSIS — M25529 Pain in unspecified elbow: Secondary | ICD-10-CM

## 2011-03-09 NOTE — Assessment & Plan Note (Signed)
HISTORY OF PRESENT ILLNESS:  This is a patient of Dr. Riley Kill seen for right shoulder pain as well as knee and hand and foot pain.  He reports no change in his pain at 3-5.  General activity level is 0-4.  The pain is worse in the evening.  Pain is worse with walking, bending.  Rest, heat, medication tend to help.  He uses a cane for ambulation.  He does climb steps and drive and walk about 10 minutes at a time. Functionally, he is on disability.  REVIEW OF SYSTEMS:  Notable for difficulties described above, otherwise unremarkable.  Last UDS and pill count consistent.  PAST MEDICAL HISTORY/SOCIAL HISTORY/FAMILY HISTORY:  Unchanged.  PHYSICAL EXAMINATION:  VITAL SIGNS:  Blood pressure is 151/67, pulse 89, respirations 16, O2 sats 94 on room air. NEUROLOGIC:  His motor strength and sensation appear to be intact in the upper and lower extremities.  Constitutionally, within normal limits. He is alert and oriented x3.  He has a significant limp.  ASSESSMENT: 1. Degenerative joint disease, right shoulder. 2. Bilateral hip arthroplasties and chronic pain. 3. Knee and back pain.  PLAN: 1. Refill Xanax 0.5 mg 1 p.o. b.i.d., 60 with 3 refills. 2. Two prescriptions for Lortab 10/500 one p.o. q.8 h. p.r.n., 90 with     no refill, and 2 prescriptions for methadone 10 mg 2 p.o. t.i.d.     180 with no refill.  He will follow up here in the clinic in 2     months.  His questions were encouraged and answered.     Scott Jefferson L. Blima Dessert Electronically Signed    RLW/MedQ D:  03/08/2011 13:41:21  T:  03/09/2011 06:14:59  Job #:  [id number]

## 2011-05-12 ENCOUNTER — Encounter: Payer: Self-pay | Admitting: *Deleted

## 2011-05-12 ENCOUNTER — Encounter: Payer: Self-pay | Attending: Neurosurgery | Admitting: *Deleted

## 2011-05-12 VITALS — BP 161/64 | HR 91 | Resp 18 | Ht 72.0 in | Wt 205.0 lb

## 2011-05-12 DIAGNOSIS — M25569 Pain in unspecified knee: Secondary | ICD-10-CM | POA: Insufficient documentation

## 2011-05-12 DIAGNOSIS — M25529 Pain in unspecified elbow: Secondary | ICD-10-CM

## 2011-05-12 DIAGNOSIS — Z96649 Presence of unspecified artificial hip joint: Secondary | ICD-10-CM | POA: Insufficient documentation

## 2011-05-12 DIAGNOSIS — M549 Dorsalgia, unspecified: Secondary | ICD-10-CM | POA: Insufficient documentation

## 2011-05-12 DIAGNOSIS — G8929 Other chronic pain: Secondary | ICD-10-CM | POA: Insufficient documentation

## 2011-05-12 DIAGNOSIS — G894 Chronic pain syndrome: Secondary | ICD-10-CM

## 2011-05-12 DIAGNOSIS — M19019 Primary osteoarthritis, unspecified shoulder: Secondary | ICD-10-CM | POA: Insufficient documentation

## 2011-05-12 DIAGNOSIS — M545 Low back pain: Secondary | ICD-10-CM

## 2011-05-12 DIAGNOSIS — M25559 Pain in unspecified hip: Secondary | ICD-10-CM | POA: Insufficient documentation

## 2011-05-12 MED ORDER — METHADONE HCL 10 MG PO TABS: 20.0000 mg | ORAL_TABLET | Freq: Three times a day (TID) | ORAL | Status: AC

## 2011-05-12 MED ORDER — METHADONE HCL 10 MG PO TABS: 20.0000 mg | ORAL_TABLET | Freq: Three times a day (TID) | ORAL | Status: DC

## 2011-05-12 MED ORDER — HYDROCODONE-ACETAMINOPHEN 10-500 MG PO TABS: 1.0000 | ORAL_TABLET | Freq: Three times a day (TID) | ORAL | Status: DC | PRN

## 2011-05-12 NOTE — Progress Notes (Signed)
Reports increased hand stiffness and pain recently; says most of his joints hurt to some extent. He read and signed the Opioid Informed Consent today. He will discuss w/ front office his indigent status which expires in May 2013. No questions voiced for MD.

## 2011-05-12 NOTE — Progress Notes (Signed)
Addended by: Leonie Douglas A on: 05/12/2011 04:34 PM   Modules accepted: Orders

## 2011-07-15 ENCOUNTER — Encounter: Payer: Self-pay | Attending: Neurosurgery | Admitting: Physical Medicine & Rehabilitation

## 2011-07-15 ENCOUNTER — Encounter: Payer: Self-pay | Admitting: Physical Medicine & Rehabilitation

## 2011-07-15 VITALS — BP 166/82 | HR 87 | Ht 72.0 in | Wt 201.0 lb

## 2011-07-15 DIAGNOSIS — M170 Bilateral primary osteoarthritis of knee: Secondary | ICD-10-CM | POA: Insufficient documentation

## 2011-07-15 DIAGNOSIS — M19079 Primary osteoarthritis, unspecified ankle and foot: Secondary | ICD-10-CM | POA: Insufficient documentation

## 2011-07-15 DIAGNOSIS — IMO0002 Reserved for concepts with insufficient information to code with codable children: Secondary | ICD-10-CM | POA: Insufficient documentation

## 2011-07-15 DIAGNOSIS — M87051 Idiopathic aseptic necrosis of right femur: Secondary | ICD-10-CM | POA: Insufficient documentation

## 2011-07-15 DIAGNOSIS — M87059 Idiopathic aseptic necrosis of unspecified femur: Secondary | ICD-10-CM | POA: Insufficient documentation

## 2011-07-15 DIAGNOSIS — M19071 Primary osteoarthritis, right ankle and foot: Secondary | ICD-10-CM | POA: Insufficient documentation

## 2011-07-15 DIAGNOSIS — M19072 Primary osteoarthritis, left ankle and foot: Secondary | ICD-10-CM

## 2011-07-15 DIAGNOSIS — M1710 Unilateral primary osteoarthritis, unspecified knee: Secondary | ICD-10-CM | POA: Insufficient documentation

## 2011-07-15 MED ORDER — ALPRAZOLAM 0.5 MG PO TABS: 0.5000 mg | ORAL_TABLET | Freq: Two times a day (BID) | ORAL | Status: AC

## 2011-07-15 MED ORDER — METHADONE HCL 10 MG PO TABS: 20.0000 mg | ORAL_TABLET | Freq: Three times a day (TID) | ORAL | Status: DC

## 2011-07-15 MED ORDER — HYDROCODONE-ACETAMINOPHEN 10-500 MG PO TABS: 1.0000 | ORAL_TABLET | Freq: Three times a day (TID) | ORAL | Status: DC | PRN

## 2011-07-15 NOTE — Progress Notes (Signed)
  Subjective:    Patient ID: Scott Jefferson, male    DOB: 1955/11/23, 56 y.o.   MRN: [id number]  HPI  Scott Jefferson is back regarding his chronic pain problems.  He has developed pain now in his feet and his hands which he attributes to arthritis. His knees and hips remain about the same.  He uses a cane for balance and to offload his legs. He is a limited ambulator at home because of his pain. He continues on the methadone and lortab for pain control.  Bowel and bladder function are normal.  Pain Inventory Average Pain 4 Pain Right Now 5 My pain is constant  In the last 24 hours, has pain interfered with the following? General activity 4 Relation with others 0 Enjoyment of life 0 What TIME of day is your pain at its worst? night Sleep (in general) Fair  Pain is worse with: walking and bending Pain improves with: rest, heat/ice and medication Relief from Meds: 7  Mobility use a cane how many minutes can you walk? 5-10 min ability to climb steps?  yes do you drive?  yes Do you have any goals in this area?  yes  Function disabled: date disabled  Do you have any goals in this area?  yes  Neuro/Psych No problems in this area  Prior Studies Any changes since last visit?  no  Physicians involved in your care Any changes since last visit?  no       Review of Systems  All other systems reviewed and are negative.       Objective:   Physical Exam  Constitutional: He appears well-developed and well-nourished.  HENT:  Head: Normocephalic and atraumatic.  Nose: Nose normal.  Mouth/Throat: Oropharynx is clear and moist.  Eyes: Conjunctivae and EOM are normal.  Neck: Normal range of motion.  Cardiovascular: Normal rate and regular rhythm.   Pulmonary/Chest: Effort normal and breath sounds normal.  Abdominal: Soft. Bowel sounds are normal.  Musculoskeletal:       Right hip: He exhibits decreased range of motion.       Left hip: He exhibits decreased range of motion.    Right knee: He exhibits decreased range of motion. tenderness found.       Left knee: He exhibits decreased range of motion. tenderness found.       Right foot: He exhibits tenderness.       Left foot: He exhibits decreased range of motion.  Psychiatric: He has a normal mood and affect. His behavior is normal. Judgment and thought content normal.          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 Now with symptoms in the hands and feet PLAN:  1. We will continue methadone and Lortab which were refilled today with scripts for next month also. I also refilled his xanax today. 2..Maintain exercise to tolerance. 3. Follow up with my PA in 2 months. All questions were encouraged and answered.

## 2011-09-12 ENCOUNTER — Encounter: Payer: Self-pay | Attending: Physical Medicine & Rehabilitation | Admitting: Physical Medicine and Rehabilitation

## 2011-09-12 ENCOUNTER — Encounter: Payer: Self-pay | Admitting: Physical Medicine and Rehabilitation

## 2011-09-12 VITALS — BP 150/87 | HR 86 | Resp 14 | Ht 72.0 in | Wt 200.0 lb

## 2011-09-12 DIAGNOSIS — M19019 Primary osteoarthritis, unspecified shoulder: Secondary | ICD-10-CM | POA: Insufficient documentation

## 2011-09-12 DIAGNOSIS — M1710 Unilateral primary osteoarthritis, unspecified knee: Secondary | ICD-10-CM | POA: Insufficient documentation

## 2011-09-12 DIAGNOSIS — M1990 Unspecified osteoarthritis, unspecified site: Secondary | ICD-10-CM

## 2011-09-12 DIAGNOSIS — M19071 Primary osteoarthritis, right ankle and foot: Secondary | ICD-10-CM

## 2011-09-12 DIAGNOSIS — M87059 Idiopathic aseptic necrosis of unspecified femur: Secondary | ICD-10-CM

## 2011-09-12 DIAGNOSIS — M19079 Primary osteoarthritis, unspecified ankle and foot: Secondary | ICD-10-CM

## 2011-09-12 DIAGNOSIS — M1610 Unilateral primary osteoarthritis, unspecified hip: Secondary | ICD-10-CM | POA: Insufficient documentation

## 2011-09-12 DIAGNOSIS — M170 Bilateral primary osteoarthritis of knee: Secondary | ICD-10-CM

## 2011-09-12 DIAGNOSIS — M79609 Pain in unspecified limb: Secondary | ICD-10-CM | POA: Insufficient documentation

## 2011-09-12 DIAGNOSIS — M479 Spondylosis, unspecified: Secondary | ICD-10-CM | POA: Insufficient documentation

## 2011-09-12 DIAGNOSIS — M87051 Idiopathic aseptic necrosis of right femur: Secondary | ICD-10-CM

## 2011-09-12 DIAGNOSIS — M169 Osteoarthritis of hip, unspecified: Secondary | ICD-10-CM | POA: Insufficient documentation

## 2011-09-12 DIAGNOSIS — Z96649 Presence of unspecified artificial hip joint: Secondary | ICD-10-CM | POA: Insufficient documentation

## 2011-09-12 MED ORDER — METHADONE HCL 10 MG PO TABS: 20.0000 mg | ORAL_TABLET | Freq: Three times a day (TID) | ORAL | Status: AC

## 2011-09-12 MED ORDER — HYDROCODONE-ACETAMINOPHEN 10-500 MG PO TABS: 1.0000 | ORAL_TABLET | Freq: Three times a day (TID) | ORAL | Status: AC | PRN

## 2011-09-12 NOTE — Progress Notes (Signed)
Subjective:    Patient ID: Scott Jefferson, male    DOB: June 23, 1955, 56 y.o.   MRN: [id number]  HPI Scott Jefferson is back regarding his chronic pain problems. He has developed pain now in his feet and his hands which he attributes to arthritis. His knees and hips remain about the same. He uses a cane for balance and to offload his legs. He is a limited ambulator at home because of his pain. He continues on the methadone and lortab for pain control. Bowel and bladder function are normal. His problem has been stable.  Pain Inventory Average Pain 6 Pain Right Now 4 My pain is constant  In the last 24 hours, has pain interfered with the following? General activity 5 Relation with others 0 Enjoyment of life 0 What TIME of day is your pain at its worst? evening Sleep (in general) Poor  Pain is worse with: walking and bending Pain improves with: rest, heat/ice and medication Relief from Meds: 7  Mobility use a cane how many minutes can you walk? 5-10 ability to climb steps?  yes do you drive?  yes Do you have any goals in this area?  yes  Function not employed: date last employed  Do you have any goals in this area?  yes  Neuro/Psych No problems in this area  Prior Studies Any changes since last visit?  no  Physicians involved in your care Any changes since last visit?  no   Family History  Problem Relation Age of Onset  . Alzheimer's disease Mother   . Cancer Mother   . Cancer Father    History   Social History  . Marital Status: Married    Spouse Name: N/A    Number of Children: N/A  . Years of Education: N/A   Social History Main Topics  . Smoking status: Former Smoker    Quit date: 05/12/1991  . Smokeless tobacco: None  . Alcohol Use: No     occasionally  . Drug Use: No  . Sexually Active: None   Other Topics Concern  . None   Social History Narrative  . None   Past Surgical History  Procedure Date  . Hip surgery   . Shoulder surgery   .  Tonsillectomy    Past Medical History  Diagnosis Date  . Hyperlipidemia    BP 150/87  Pulse 86  Resp 14  Ht 6' (1.829 m)  Wt 200 lb (90.719 kg)  BMI 27.12 kg/m2  SpO2 97%     Review of Systems  All other systems reviewed and are negative.       Objective:   Physical Exam Constitutional: He appears well-developed and well-nourished.  HENT:  Head: Normocephalic and atraumatic.  Nose: Nose normal.  Mouth/Throat: Oropharynx is clear and moist.  Eyes: Conjunctivae and EOM are normal.  Neck: Normal range of motion.  Cardiovascular: Normal rate and regular rhythm.  Pulmonary/Chest: Effort normal and breath sounds normal.  Abdominal: Soft. Bowel sounds are normal.  Musculoskeletal:  Right hip: He exhibits decreased range of motion.  Left hip: He exhibits decreased range of motion.  Right knee: He exhibits decreased range of motion. tenderness found.  Left knee: He exhibits decreased range of motion. tenderness found.  Right foot: He exhibits decreased ROM.  Left foot: He exhibits decreased range of motion.  Right shoulder decreased ROM, mainly abduction 80 degrees, flexion 100 degrees Psychiatric: He has a normal mood and affect. His behavior is normal. Judgment and thought content normal.  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 Now with symptoms in the hands and feet  PLAN:  1. We will continue methadone and Lortab which were refilled today with scripts for next month also. I also refilled his xanax today.  2..Maintain exercise to tolerance, also continue exercising in the pool.  3. Follow up with my PA in 2 months. All questions were encouraged and answered.

## 2011-09-12 NOTE — Patient Instructions (Signed)
Continue with exercises and walking in the pool.Continue with medication

## 2011-09-22 ENCOUNTER — Telehealth: Payer: Self-pay | Admitting: *Deleted

## 2011-09-22 NOTE — Telephone Encounter (Signed)
Pt states that he had a beer the day before the test was done.

## 2011-09-22 NOTE — Telephone Encounter (Signed)
Inconsistent UDS. Positive for ETOH.  LM with a male to have him call back.

## 2011-09-26 ENCOUNTER — Telehealth: Payer: Self-pay | Admitting: *Deleted

## 2011-09-26 NOTE — Telephone Encounter (Signed)
Message copied by Caryl Ada on Mon Sep 26, 2011  9:12 AM ------      Message from: Su Monks      Created: Fri Sep 23, 2011 10:45 AM       Talked to Dr. Riley Kill, patient should be d/c , please ask Dr. Riley Kill about how to taper down the methadone

## 2011-09-26 NOTE — Telephone Encounter (Signed)
Methadone: 20mg  am , 10mg  pm, 60m qhs x 5 days 20mg  am,   10mg  pm, 10mg  qhs x 5 days 10mg  TID x 5days 10mg  BID x 5 days 10mg  QD x 5 days then off.   #[phone]

## 2011-09-26 NOTE — Telephone Encounter (Signed)
Pt aware of discharge status.   Please advise how he should taper down off of Methadone.

## 2011-09-26 NOTE — Telephone Encounter (Signed)
Pt aware how to come off methadone.

## 2011-11-10 ENCOUNTER — Encounter: Payer: Self-pay | Admitting: Physical Medicine and Rehabilitation

## 2013-12-30 ENCOUNTER — Other Ambulatory Visit: Payer: Self-pay | Admitting: Neurosurgery

## 2014-01-20 ENCOUNTER — Encounter (HOSPITAL_COMMUNITY)
Admission: RE | Admit: 2014-01-20 | Discharge: 2014-01-20 | Disposition: A | Discharge status: Home / Self Care | Payer: Non-veteran care | Source: Ambulatory Visit | Attending: Neurosurgery | Admitting: Neurosurgery

## 2014-01-20 ENCOUNTER — Encounter (HOSPITAL_COMMUNITY)
Admission: RE | Admit: 2014-01-20 | Discharge: 2014-01-20 | Disposition: A | Discharge status: Home / Self Care | Payer: Non-veteran care | Source: Ambulatory Visit | Attending: Anesthesiology | Admitting: Anesthesiology

## 2014-01-20 ENCOUNTER — Encounter (HOSPITAL_COMMUNITY): Payer: Self-pay

## 2014-01-20 DIAGNOSIS — E785 Hyperlipidemia, unspecified: Secondary | ICD-10-CM | POA: Diagnosis not present

## 2014-01-20 DIAGNOSIS — R0602 Shortness of breath: Secondary | ICD-10-CM | POA: Diagnosis not present

## 2014-01-20 DIAGNOSIS — I4891 Unspecified atrial fibrillation: Secondary | ICD-10-CM | POA: Diagnosis not present

## 2014-01-20 DIAGNOSIS — Z85828 Personal history of other malignant neoplasm of skin: Secondary | ICD-10-CM | POA: Diagnosis not present

## 2014-01-20 DIAGNOSIS — K219 Gastro-esophageal reflux disease without esophagitis: Secondary | ICD-10-CM | POA: Diagnosis not present

## 2014-01-20 DIAGNOSIS — M5001 Cervical disc disorder with myelopathy,  high cervical region: Secondary | ICD-10-CM | POA: Diagnosis present

## 2014-01-20 DIAGNOSIS — R0600 Dyspnea, unspecified: Secondary | ICD-10-CM

## 2014-01-20 DIAGNOSIS — Z7982 Long term (current) use of aspirin: Secondary | ICD-10-CM | POA: Diagnosis not present

## 2014-01-20 DIAGNOSIS — M1990 Unspecified osteoarthritis, unspecified site: Secondary | ICD-10-CM | POA: Diagnosis not present

## 2014-01-20 DIAGNOSIS — Z91040 Latex allergy status: Secondary | ICD-10-CM | POA: Diagnosis not present

## 2014-01-20 DIAGNOSIS — J449 Chronic obstructive pulmonary disease, unspecified: Secondary | ICD-10-CM | POA: Diagnosis not present

## 2014-01-20 HISTORY — DX: Reserved for inherently not codable concepts without codable children: IMO0001

## 2014-01-20 HISTORY — DX: Malignant (primary) neoplasm, unspecified: C80.1

## 2014-01-20 HISTORY — DX: Chronic obstructive pulmonary disease, unspecified: J44.9

## 2014-01-20 HISTORY — DX: Unspecified osteoarthritis, unspecified site: M19.90

## 2014-01-20 HISTORY — DX: Gastro-esophageal reflux disease without esophagitis: K21.9

## 2014-01-20 HISTORY — DX: Cardiac arrhythmia, unspecified: I49.9

## 2014-01-20 LAB — CBC
HEMATOCRIT: 43.2 % (ref 39.0–52.0)
HEMOGLOBIN: 14.7 g/dL (ref 13.0–17.0)
MCH: 29.8 pg (ref 26.0–34.0)
MCHC: 34 g/dL (ref 30.0–36.0)
MCV: 87.4 fL (ref 78.0–100.0)
Platelets: 171 10*3/uL (ref 150–400)
RBC: 4.94 MIL/uL (ref 4.22–5.81)
RDW: 13.2 % (ref 11.5–15.5)
WBC: 4.4 10*3/uL (ref 4.0–10.5)

## 2014-01-20 LAB — SURGICAL PCR SCREEN
MRSA, PCR: NEGATIVE
STAPHYLOCOCCUS AUREUS: NEGATIVE

## 2014-01-20 LAB — BASIC METABOLIC PANEL
Anion gap: 13 (ref 5–15)
BUN: 8 mg/dL (ref 6–23)
CALCIUM: 9.5 mg/dL (ref 8.4–10.5)
CHLORIDE: 103 meq/L (ref 96–112)
CO2: 24 meq/L (ref 19–32)
CREATININE: 0.98 mg/dL (ref 0.50–1.35)
GFR calc Af Amer: >90 mL/min (ref >90)
GFR calc non Af Amer: 89 mL/min — ABNORMAL LOW (ref >90)
Glucose, Bld: 128 mg/dL — ABNORMAL HIGH (ref 70–99)
Potassium: 4.1 mEq/L (ref 3.7–5.3)
Sodium: 140 mEq/L (ref 137–147)

## 2014-01-20 NOTE — Pre-Procedure Instructions (Signed)
Jody Silas  01/20/2014   Your procedure is scheduled on:  Friday Dec. 4  Report to [facility] Main Entrance "A"at 5:30 AM.  Call this number if you have problems the morning of surgery: [phone]               If any questions prior to surgery call pre-admissions [phone]   Remember:   Do not eat food or drink liquids after midnight.   Take these medicines the morning of surgery with A SIP OF WATER: albuterol inhaler (bring to hospital), flexeril if needed, hydrocodone if needed, oxycodone, aciphex   Do not wear jewelry, make-up or nail polish.  Do not wear lotions, powders, or perfumes. You may wear deodorant.  Do not shave 48 hours prior to surgery. Men may shave face and neck.  Do not bring valuables to the hospital.  [facility] is not responsible   for any belongings or valuables.               Contacts, dentures or bridgework may not be worn into surgery.  Leave suitcase in the car. After surgery it may be brought to your room.  For patients admitted to the hospital, discharge time is determined by your                treatment team.               Patients discharged the day of surgery will not be allowed to drive  home.  Name and phone number of your driver:   Special Instructions: review preparing for surgery handout   Please read over the following fact sheets that you were given: Pain Booklet, Coughing and Deep Breathing, MRSA Information and Surgical Site Infection Prevention

## 2014-01-20 NOTE — Progress Notes (Signed)
PCP: Dr. Mariana Arn at [facility] in [place], [place].  Requested stress/echo/cath report/ekg/notes from [place] in [place] [phone])  Pt. States Dr. Hal Neer has  clearance from the [place] in [place]. Will request. Lorriane Shire states she will fax it over.

## 2014-01-21 ENCOUNTER — Other Ambulatory Visit (HOSPITAL_COMMUNITY): Payer: Self-pay

## 2014-01-30 MED ORDER — CEFAZOLIN SODIUM-DEXTROSE 2-3 GM-% IV SOLR
2.0000 g | INTRAVENOUS | Status: AC
  Administered 2014-01-31: 2 g via INTRAVENOUS
  Filled 2014-01-30: qty 50

## 2014-01-31 ENCOUNTER — Encounter (HOSPITAL_COMMUNITY): Payer: Self-pay | Admitting: Anesthesiology

## 2014-01-31 ENCOUNTER — Ambulatory Visit (HOSPITAL_COMMUNITY): Payer: Non-veteran care | Admitting: Anesthesiology

## 2014-01-31 ENCOUNTER — Ambulatory Visit (HOSPITAL_COMMUNITY): Payer: Non-veteran care

## 2014-01-31 ENCOUNTER — Ambulatory Visit (HOSPITAL_COMMUNITY)
Admission: RE | Admit: 2014-01-31 | Discharge: 2014-01-31 | Disposition: A | Discharge status: Home / Self Care | Payer: Non-veteran care | Source: Ambulatory Visit | Attending: Neurosurgery | Admitting: Neurosurgery

## 2014-01-31 ENCOUNTER — Encounter (HOSPITAL_COMMUNITY)
Admission: RE | Disposition: A | Discharge status: Home / Self Care | Payer: Non-veteran care | Source: Ambulatory Visit | Attending: Neurosurgery

## 2014-01-31 DIAGNOSIS — Z7982 Long term (current) use of aspirin: Secondary | ICD-10-CM | POA: Insufficient documentation

## 2014-01-31 DIAGNOSIS — K219 Gastro-esophageal reflux disease without esophagitis: Secondary | ICD-10-CM | POA: Insufficient documentation

## 2014-01-31 DIAGNOSIS — M5001 Cervical disc disorder with myelopathy,  high cervical region: Secondary | ICD-10-CM | POA: Diagnosis not present

## 2014-01-31 DIAGNOSIS — E785 Hyperlipidemia, unspecified: Secondary | ICD-10-CM | POA: Insufficient documentation

## 2014-01-31 DIAGNOSIS — M1990 Unspecified osteoarthritis, unspecified site: Secondary | ICD-10-CM | POA: Insufficient documentation

## 2014-01-31 DIAGNOSIS — M4802 Spinal stenosis, cervical region: Secondary | ICD-10-CM

## 2014-01-31 DIAGNOSIS — Z91040 Latex allergy status: Secondary | ICD-10-CM | POA: Insufficient documentation

## 2014-01-31 DIAGNOSIS — Z85828 Personal history of other malignant neoplasm of skin: Secondary | ICD-10-CM | POA: Insufficient documentation

## 2014-01-31 DIAGNOSIS — I4891 Unspecified atrial fibrillation: Secondary | ICD-10-CM | POA: Insufficient documentation

## 2014-01-31 DIAGNOSIS — R0602 Shortness of breath: Secondary | ICD-10-CM | POA: Insufficient documentation

## 2014-01-31 DIAGNOSIS — J449 Chronic obstructive pulmonary disease, unspecified: Secondary | ICD-10-CM | POA: Insufficient documentation

## 2014-01-31 HISTORY — PX: ANTERIOR CERVICAL DECOMP/DISCECTOMY FUSION: SHX1161

## 2014-01-31 SURGERY — ANTERIOR CERVICAL DECOMPRESSION/DISCECTOMY FUSION 1 LEVEL
Anesthesia: General | Site: Spine Cervical

## 2014-01-31 MED ORDER — PHENYLEPHRINE 40 MCG/ML (10ML) SYRINGE FOR IV PUSH (FOR BLOOD PRESSURE SUPPORT)
PREFILLED_SYRINGE | INTRAVENOUS | Status: AC
  Filled 2014-01-31: qty 10

## 2014-01-31 MED ORDER — METHADONE HCL 10 MG PO TABS: 20.0000 mg | ORAL_TABLET | Freq: Three times a day (TID) | ORAL | Status: DC

## 2014-01-31 MED ORDER — OXYCODONE HCL 5 MG/5ML PO SOLN: 5.0000 mg | Freq: Once | ORAL | Status: AC | PRN

## 2014-01-31 MED ORDER — ARTIFICIAL TEARS OP OINT
TOPICAL_OINTMENT | OPHTHALMIC | Status: AC
  Filled 2014-01-31: qty 3.5

## 2014-01-31 MED ORDER — ALPRAZOLAM 0.5 MG PO TABS: 0.5000 mg | ORAL_TABLET | Freq: Two times a day (BID) | ORAL | Status: DC

## 2014-01-31 MED ORDER — CEFAZOLIN SODIUM-DEXTROSE 2-3 GM-% IV SOLR
2.0000 g | Freq: Three times a day (TID) | INTRAVENOUS | Status: DC
  Filled 2014-01-31 (×2): qty 50

## 2014-01-31 MED ORDER — MIDAZOLAM HCL 2 MG/2ML IJ SOLN
INTRAMUSCULAR | Status: AC
  Filled 2014-01-31: qty 2

## 2014-01-31 MED ORDER — SODIUM CHLORIDE 0.9 % IJ SOLN
INTRAMUSCULAR | Status: AC
  Filled 2014-01-31: qty 10

## 2014-01-31 MED ORDER — HYDROMORPHONE HCL 1 MG/ML IJ SOLN
0.2500 mg | INTRAMUSCULAR | Status: DC | PRN
  Administered 2014-01-31 (×4): 0.5 mg via INTRAVENOUS

## 2014-01-31 MED ORDER — DEXAMETHASONE 4 MG PO TABS: 4.0000 mg | ORAL_TABLET | Freq: Four times a day (QID) | ORAL | Status: DC

## 2014-01-31 MED ORDER — ACETAMINOPHEN 650 MG RE SUPP: 650.0000 mg | RECTAL | Status: DC | PRN

## 2014-01-31 MED ORDER — LIDOCAINE HCL (CARDIAC) 20 MG/ML IV SOLN
INTRAVENOUS | Status: DC | PRN
  Administered 2014-01-31: 70 mg via INTRAVENOUS

## 2014-01-31 MED ORDER — PHENYLEPHRINE HCL 10 MG/ML IJ SOLN
INTRAMUSCULAR | Status: DC | PRN
  Administered 2014-01-31: 120 ug via INTRAVENOUS

## 2014-01-31 MED ORDER — FENTANYL CITRATE 0.05 MG/ML IJ SOLN
INTRAMUSCULAR | Status: DC | PRN
  Administered 2014-01-31 (×3): 50 ug via INTRAVENOUS
  Administered 2014-01-31: 100 ug via INTRAVENOUS

## 2014-01-31 MED ORDER — PROPOFOL 10 MG/ML IV BOLUS
INTRAVENOUS | Status: AC
  Filled 2014-01-31: qty 20

## 2014-01-31 MED ORDER — ALBUTEROL SULFATE (2.5 MG/3ML) 0.083% IN NEBU: 2.5000 mg | INHALATION_SOLUTION | Freq: Four times a day (QID) | RESPIRATORY_TRACT | Status: DC | PRN

## 2014-01-31 MED ORDER — LIDOCAINE HCL 4 % MT SOLN
OROMUCOSAL | Status: DC | PRN
  Administered 2014-01-31: 4 mL via TOPICAL

## 2014-01-31 MED ORDER — DEXAMETHASONE SODIUM PHOSPHATE 10 MG/ML IJ SOLN
INTRAMUSCULAR | Status: AC
  Filled 2014-01-31: qty 1

## 2014-01-31 MED ORDER — PANTOPRAZOLE SODIUM 40 MG IV SOLR
40.0000 mg | Freq: Every day | INTRAVENOUS | Status: DC
  Filled 2014-01-31: qty 40

## 2014-01-31 MED ORDER — LIDOCAINE HCL (CARDIAC) 20 MG/ML IV SOLN
INTRAVENOUS | Status: AC
  Filled 2014-01-31: qty 5

## 2014-01-31 MED ORDER — DOCUSATE SODIUM 100 MG PO CAPS
100.0000 mg | ORAL_CAPSULE | Freq: Two times a day (BID) | ORAL | Status: DC
  Filled 2014-01-31 (×2): qty 1

## 2014-01-31 MED ORDER — OXYCODONE HCL 5 MG PO TABS
ORAL_TABLET | ORAL | Status: AC
  Filled 2014-01-31: qty 1

## 2014-01-31 MED ORDER — DIPHENHYDRAMINE HCL 50 MG/ML IJ SOLN
INTRAMUSCULAR | Status: DC | PRN
  Administered 2014-01-31: 12.5 mg via INTRAVENOUS

## 2014-01-31 MED ORDER — HYDROMORPHONE HCL 1 MG/ML IJ SOLN
INTRAMUSCULAR | Status: AC
  Filled 2014-01-31: qty 1

## 2014-01-31 MED ORDER — ARTIFICIAL TEARS OP OINT
TOPICAL_OINTMENT | OPHTHALMIC | Status: DC | PRN
  Administered 2014-01-31: 1 via OPHTHALMIC

## 2014-01-31 MED ORDER — SODIUM CHLORIDE 0.9 % IJ SOLN: 3.0000 mL | INTRAMUSCULAR | Status: DC | PRN

## 2014-01-31 MED ORDER — ROSUVASTATIN CALCIUM 20 MG PO TABS
20.0000 mg | ORAL_TABLET | Freq: Every day | ORAL | Status: DC
  Filled 2014-01-31: qty 1

## 2014-01-31 MED ORDER — CYCLOBENZAPRINE HCL 10 MG PO TABS
ORAL_TABLET | ORAL | Status: AC
  Filled 2014-01-31: qty 1

## 2014-01-31 MED ORDER — ACETAMINOPHEN 325 MG PO TABS: 650.0000 mg | ORAL_TABLET | ORAL | Status: DC | PRN

## 2014-01-31 MED ORDER — HYDROMORPHONE HCL 1 MG/ML IJ SOLN: 1.0000 mg | INTRAMUSCULAR | Status: DC | PRN

## 2014-01-31 MED ORDER — DIPHENHYDRAMINE HCL 50 MG/ML IJ SOLN
INTRAMUSCULAR | Status: AC
  Filled 2014-01-31: qty 1

## 2014-01-31 MED ORDER — LACTATED RINGERS IV SOLN
INTRAVENOUS | Status: DC | PRN
Start: 2014-01-31 — End: 2014-01-31
  Administered 2014-01-31 (×2): via INTRAVENOUS

## 2014-01-31 MED ORDER — ONDANSETRON HCL 4 MG/2ML IJ SOLN
INTRAMUSCULAR | Status: DC | PRN
  Administered 2014-01-31: 4 mg via INTRAVENOUS

## 2014-01-31 MED ORDER — SODIUM CHLORIDE 0.9 % IJ SOLN
3.0000 mL | Freq: Two times a day (BID) | INTRAMUSCULAR | Status: DC
  Administered 2014-01-31: 3 mL via INTRAVENOUS

## 2014-01-31 MED ORDER — NEOSTIGMINE METHYLSULFATE 10 MG/10ML IV SOLN
INTRAVENOUS | Status: DC | PRN
Start: 2014-01-31 — End: 2014-01-31
  Administered 2014-01-31: 4 mg via INTRAVENOUS

## 2014-01-31 MED ORDER — KCL IN DEXTROSE-NACL 20-5-0.45 MEQ/L-%-% IV SOLN
80.0000 mL/h | INTRAVENOUS | Status: DC
  Filled 2014-01-31 (×2): qty 1000

## 2014-01-31 MED ORDER — 0.9 % SODIUM CHLORIDE (POUR BTL) OPTIME
TOPICAL | Status: DC | PRN
  Administered 2014-01-31: 1000 mL

## 2014-01-31 MED ORDER — MIDAZOLAM HCL 5 MG/5ML IJ SOLN
INTRAMUSCULAR | Status: DC | PRN
  Administered 2014-01-31 (×2): 1 mg via INTRAVENOUS

## 2014-01-31 MED ORDER — EPHEDRINE SULFATE 50 MG/ML IJ SOLN
INTRAMUSCULAR | Status: AC
  Filled 2014-01-31: qty 1

## 2014-01-31 MED ORDER — ROCURONIUM BROMIDE 50 MG/5ML IV SOLN
INTRAVENOUS | Status: AC
  Filled 2014-01-31: qty 1

## 2014-01-31 MED ORDER — DEXAMETHASONE SODIUM PHOSPHATE 4 MG/ML IJ SOLN
4.0000 mg | Freq: Four times a day (QID) | INTRAMUSCULAR | Status: DC
  Administered 2014-01-31: 4 mg via INTRAVENOUS

## 2014-01-31 MED ORDER — SODIUM CHLORIDE 0.9 % IR SOLN
Status: DC | PRN
  Administered 2014-01-31: 500 mL

## 2014-01-31 MED ORDER — CYCLOBENZAPRINE HCL 10 MG PO TABS
10.0000 mg | ORAL_TABLET | Freq: Three times a day (TID) | ORAL | Status: DC | PRN
  Administered 2014-01-31: 10 mg via ORAL

## 2014-01-31 MED ORDER — PHENOL 1.4 % MT LIQD
1.0000 | OROMUCOSAL | Status: DC | PRN
  Administered 2014-01-31: 1 via OROMUCOSAL

## 2014-01-31 MED ORDER — PROPOFOL 10 MG/ML IV BOLUS
INTRAVENOUS | Status: DC | PRN
  Administered 2014-01-31: 200 mg via INTRAVENOUS

## 2014-01-31 MED ORDER — MENTHOL 3 MG MT LOZG: 1.0000 | LOZENGE | OROMUCOSAL | Status: DC | PRN

## 2014-01-31 MED ORDER — ROCURONIUM BROMIDE 100 MG/10ML IV SOLN
INTRAVENOUS | Status: DC | PRN
  Administered 2014-01-31: 50 mg via INTRAVENOUS

## 2014-01-31 MED ORDER — THROMBIN 5000 UNITS EX SOLR
CUTANEOUS | Status: DC | PRN
  Administered 2014-01-31 (×2): 5000 [IU] via TOPICAL

## 2014-01-31 MED ORDER — HEMOSTATIC AGENTS (NO CHARGE) OPTIME
TOPICAL | Status: DC | PRN
  Administered 2014-01-31 (×2): 1 via TOPICAL

## 2014-01-31 MED ORDER — OXYCODONE HCL 5 MG PO TABS
5.0000 mg | ORAL_TABLET | Freq: Once | ORAL | Status: AC | PRN
  Administered 2014-01-31: 5 mg via ORAL

## 2014-01-31 MED ORDER — DEXAMETHASONE SODIUM PHOSPHATE 10 MG/ML IJ SOLN
10.0000 mg | INTRAMUSCULAR | Status: AC
  Administered 2014-01-31: 10 mg via INTRAVENOUS

## 2014-01-31 MED ORDER — SUCCINYLCHOLINE CHLORIDE 20 MG/ML IJ SOLN
INTRAMUSCULAR | Status: AC
  Filled 2014-01-31: qty 1

## 2014-01-31 MED ORDER — ALBUTEROL SULFATE HFA 108 (90 BASE) MCG/ACT IN AERS: 1.0000 | INHALATION_SPRAY | Freq: Four times a day (QID) | RESPIRATORY_TRACT | Status: DC | PRN

## 2014-01-31 MED ORDER — FENTANYL CITRATE 0.05 MG/ML IJ SOLN
INTRAMUSCULAR | Status: AC
  Filled 2014-01-31: qty 5

## 2014-01-31 MED ORDER — GLYCOPYRROLATE 0.2 MG/ML IJ SOLN
INTRAMUSCULAR | Status: DC | PRN
  Administered 2014-01-31: 0.6 mg via INTRAVENOUS

## 2014-01-31 MED ORDER — EPHEDRINE SULFATE 50 MG/ML IJ SOLN
INTRAMUSCULAR | Status: DC | PRN
  Administered 2014-01-31 (×2): 10 mg via INTRAVENOUS
  Administered 2014-01-31: 15 mg via INTRAVENOUS

## 2014-01-31 MED ORDER — ONDANSETRON HCL 4 MG/2ML IJ SOLN: 4.0000 mg | Freq: Four times a day (QID) | INTRAMUSCULAR | Status: DC | PRN

## 2014-01-31 MED ORDER — ONDANSETRON HCL 4 MG/2ML IJ SOLN: 4.0000 mg | INTRAMUSCULAR | Status: DC | PRN

## 2014-01-31 MED ORDER — OXYCODONE-ACETAMINOPHEN 5-325 MG PO TABS
1.0000 | ORAL_TABLET | ORAL | Status: DC | PRN
  Administered 2014-01-31: 2 via ORAL

## 2014-01-31 SURGICAL SUPPLY — 64 items
APL SKNCLS STERI-STRIP NONHPOA (GAUZE/BANDAGES/DRESSINGS)
BAG DECANTER FOR FLEXI CONT (MISCELLANEOUS) ×3 IMPLANT
BENZOIN TINCTURE PRP APPL 2/3 (GAUZE/BANDAGES/DRESSINGS) ×3 IMPLANT
BIT DRILL TRINICA 2.3MM (BIT) IMPLANT
BONE EQUIVA 5CC (Bone Implant) ×2 IMPLANT
BRUSH SCRUB EZ PLAIN DRY (MISCELLANEOUS) ×3 IMPLANT
BUR MATCHSTICK NEURO 3.0 LAGG (BURR) ×3 IMPLANT
CANISTER SUCT 3000ML (MISCELLANEOUS) ×3 IMPLANT
CLOSURE WOUND 1/2 X4 (GAUZE/BANDAGES/DRESSINGS)
CONT SPEC 4OZ CLIKSEAL STRL BL (MISCELLANEOUS) ×3 IMPLANT
DRAPE C-ARM 42X72 X-RAY (DRAPES) ×6 IMPLANT
DRAPE LAPAROTOMY 100X72 PEDS (DRAPES) ×3 IMPLANT
DRAPE MICROSCOPE LEICA (MISCELLANEOUS) ×3 IMPLANT
DRAPE POUCH INSTRU U-SHP 10X18 (DRAPES) ×3 IMPLANT
DRAPE SURG 17X23 STRL (DRAPES) ×6 IMPLANT
DRILL BIT TRINICA 2.3MM (BIT) ×3
DRSG TELFA 3X8 NADH (GAUZE/BANDAGES/DRESSINGS) IMPLANT
DURAPREP 6ML APPLICATOR 50/CS (WOUND CARE) ×3 IMPLANT
ELECT COATED BLADE 2.86 ST (ELECTRODE) ×3 IMPLANT
ELECT REM PT RETURN 9FT ADLT (ELECTROSURGICAL) ×3
ELECTRODE REM PT RTRN 9FT ADLT (ELECTROSURGICAL) ×1 IMPLANT
GAUZE SPONGE 4X4 12PLY STRL (GAUZE/BANDAGES/DRESSINGS) ×1 IMPLANT
GAUZE SPONGE 4X4 16PLY XRAY LF (GAUZE/BANDAGES/DRESSINGS) IMPLANT
GLOVE BIOGEL PI IND STRL 7.5 (GLOVE) IMPLANT
GLOVE BIOGEL PI IND STRL 8.5 (GLOVE) IMPLANT
GLOVE BIOGEL PI INDICATOR 7.5 (GLOVE) ×2
GLOVE BIOGEL PI INDICATOR 8.5 (GLOVE) ×2
GLOVE ECLIPSE 8.0 STRL XLNG CF (GLOVE) ×1 IMPLANT
GLOVE EXAM NITRILE LRG STRL (GLOVE) IMPLANT
GLOVE EXAM NITRILE XL STR (GLOVE) IMPLANT
GLOVE EXAM NITRILE XS STR PU (GLOVE) IMPLANT
GLOVE SURG SS PI 7.0 STRL IVOR (GLOVE) ×4 IMPLANT
GLOVE SURG SS PI 8.0 STRL IVOR (GLOVE) ×4 IMPLANT
GOWN STRL REUS W/ TWL LRG LVL3 (GOWN DISPOSABLE) IMPLANT
GOWN STRL REUS W/ TWL XL LVL3 (GOWN DISPOSABLE) ×1 IMPLANT
GOWN STRL REUS W/TWL 2XL LVL3 (GOWN DISPOSABLE) ×1 IMPLANT
GOWN STRL REUS W/TWL LRG LVL3 (GOWN DISPOSABLE) ×3
GOWN STRL REUS W/TWL XL LVL3 (GOWN DISPOSABLE) ×6
HALTER HD/CHIN CERV TRACTION D (MISCELLANEOUS) ×3 IMPLANT
KIT BASIN OR (CUSTOM PROCEDURE TRAY) ×3 IMPLANT
KIT ROOM TURNOVER OR (KITS) ×3 IMPLANT
LIQUID BAND (GAUZE/BANDAGES/DRESSINGS) ×2 IMPLANT
NDL SPNL 20GX3.5 QUINCKE YW (NEEDLE) ×1 IMPLANT
NEEDLE SPNL 20GX3.5 QUINCKE YW (NEEDLE) ×3 IMPLANT
NS IRRIG 1000ML POUR BTL (IV SOLUTION) ×3 IMPLANT
PACK LAMINECTOMY NEURO (CUSTOM PROCEDURE TRAY) ×3 IMPLANT
PAD ARMBOARD 7.5X6 YLW CONV (MISCELLANEOUS) ×7 IMPLANT
PAD DRESSING TELFA 3X8 NADH (GAUZE/BANDAGES/DRESSINGS) ×1 IMPLANT
PATTIES SURGICAL .25X.25 (GAUZE/BANDAGES/DRESSINGS) IMPLANT
PATTIES SURGICAL .75X.75 (GAUZE/BANDAGES/DRESSINGS) ×1 IMPLANT
PLATE 24MM (Plate) ×2 IMPLANT
RUBBERBAND STERILE (MISCELLANEOUS) ×6 IMPLANT
SCREW SELF DRILL FIXED 14MM (Screw) ×8 IMPLANT
SPACER TMS 11X14X6MM (Spacer) ×2 IMPLANT
SPONGE INTESTINAL PEANUT (DISPOSABLE) ×3 IMPLANT
SPONGE SURGIFOAM ABS GEL SZ50 (HEMOSTASIS) ×3 IMPLANT
STRIP CLOSURE SKIN 1/2X4 (GAUZE/BANDAGES/DRESSINGS) ×1 IMPLANT
SUT PDS AB 5-0 P3 18 (SUTURE) ×3 IMPLANT
SUT VIC AB 3-0 CP2 18 (SUTURE) ×3 IMPLANT
SYR 20ML ECCENTRIC (SYRINGE) ×2 IMPLANT
TOWEL OR 17X24 6PK STRL BLUE (TOWEL DISPOSABLE) ×3 IMPLANT
TOWEL OR 17X26 10 PK STRL BLUE (TOWEL DISPOSABLE) ×3 IMPLANT
TRAP SPECIMEN MUCOUS 40CC (MISCELLANEOUS) ×2 IMPLANT
WATER STERILE IRR 1000ML POUR (IV SOLUTION) ×3 IMPLANT

## 2014-01-31 NOTE — Anesthesia Procedure Notes (Signed)
Procedure Name: Intubation Date/Time: 01/31/2014 7:48 AM Performed by: Susa Loffler Pre-anesthesia Checklist: Patient identified, Timeout performed, Emergency Drugs available, Suction available and Patient being monitored Patient Re-evaluated:Patient Re-evaluated prior to inductionOxygen Delivery Method: Circle system utilized Preoxygenation: Pre-oxygenation with 100% oxygen Intubation Type: IV induction Ventilation: Mask ventilation without difficulty and Oral airway inserted - appropriate to patient size Laryngoscope Size: Mac and 4 Grade View: Grade I Tube type: Oral Tube size: 7.5 mm Number of attempts: 1 Airway Equipment and Method: Stylet and LTA kit utilized Placement Confirmation: ETT inserted through vocal cords under direct vision,  positive ETCO2 and breath sounds checked- equal and bilateral Secured at: 22 cm Tube secured with: Tape Dental Injury: Teeth and Oropharynx as per pre-operative assessment

## 2014-01-31 NOTE — Anesthesia Preprocedure Evaluation (Signed)
Anesthesia Evaluation  Patient identified by MRN, date of birth, ID band Patient awake    Reviewed: Allergy & Precautions, H&P , NPO status , Patient's Chart, lab work & pertinent test results  Airway Mallampati: II   Neck ROM: full    Dental   Pulmonary shortness of breath, COPDformer smoker,          Cardiovascular + dysrhythmias Atrial Fibrillation     Neuro/Psych    GI/Hepatic GERD-  ,  Endo/Other    Renal/GU      Musculoskeletal  (+) Arthritis -, Osteoarthritis,    Abdominal   Peds  Hematology   Anesthesia Other Findings   Reproductive/Obstetrics                             Anesthesia Physical Anesthesia Plan  ASA: II  Anesthesia Plan: General   Post-op Pain Management:    Induction: Intravenous  Airway Management Planned: Oral ETT  Additional Equipment:   Intra-op Plan:   Post-operative Plan: Extubation in OR  Informed Consent: I have reviewed the patients History and Physical, chart, labs and discussed the procedure including the risks, benefits and alternatives for the proposed anesthesia with the patient or authorized representative who has indicated his/her understanding and acceptance.     Plan Discussed with: CRNA, Anesthesiologist and Surgeon  Anesthesia Plan Comments:         Anesthesia Quick Evaluation

## 2014-01-31 NOTE — Op Note (Signed)
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 Localizing fluoroscopy was used prior to incision to identify the appropriate level. Transverse incision was made in the right anterior neck started midline and headed towards the medial aspect of the sternocleidomastoid muscle. The platysma muscle was then incised transversely and the natural fascial plane between the strap muscles medially and the sternal cremaster laterally was identified and followed down to the anterior aspect the cervical spine. Longus Cole muscles were identified and split in the midline and stripped away bilaterally with unipolar coagulation and Kitner dissection. Self-retaining tract was placed for exposure and excision approach the appropriate level. Using a 15 blade the end of the disc at C3-4 was incised. Using pituitary rongeurs and curettes approximately 90% of the disc material was removed. High-speed drill was used to widen the interspace and bony shavings were saved for use later in the case. At this time the microscope was draped brought in the field and used for the remainder of the case. Using microdissection technique the remainder of the disc material down the posterior longitudinal ligament was removed. The lamina was incised transversely and the cut edges removed a Kerrison punch. Thorough decompression was carried out on the spinal dura were large amounts of herniated disc material were identified. We then decompressed the foramen bilaterally. At this time inspection was carried out in all directions for any evidence of residual compression and none could be identified.  Irrigation was carried out and any bleeding control proper coagulation Gelfoam. Measurements were taken and a 6 mm lordotic trabecular metal graft was chosen and filled with a mixture of autologous bone and morselized allograft. The graft was then impacted without difficulty and fluoroscopy showed to be in good position. An appropriately length Trinica anterior cervical plate was then chosen. Under fluoroscopic guidance drill holes were placed followed by placing of 14 mm screws 4. Locking mechanism was rotated locked position and fossae showed good position of the plates screws and plugs. Irrigation was carried out. Any bleeding control proper coagulation. The was then closed with inverted Vicryl on the platysma muscle and subcuticular layer. Dermabond was placed on the skin. The patient was then extubated and taken to recovery in stable condition.

## 2014-01-31 NOTE — Anesthesia Postprocedure Evaluation (Signed)
Anesthesia Post Note  Patient: Scott Jefferson  Procedure(s) Performed: Procedure(s) (LRB): ANTERIOR CERVICAL DECOMPRESSION/DISCECTOMY FUSION CERVICAL THREE-FOUR (N/A)  Anesthesia type: General  Patient location: PACU  Post pain: Pain level controlled and Adequate analgesia  Post assessment: Post-op Vital signs reviewed, Patient's Cardiovascular Status Stable, Respiratory Function Stable, Patent Airway and Pain level controlled  Last Vitals:  Filed Vitals:   01/31/14 1041  BP:   Pulse: 114  Temp: 36.1 C  Resp: 17    Post vital signs: Reviewed and stable  Level of consciousness: awake, alert  and oriented  Complications: No apparent anesthesia complications

## 2014-01-31 NOTE — Discharge Instructions (Signed)
Wound Care Leave incision open to air. You may shower in 2 days. Do not scrub directly on incision.  Do not put any creams, lotions, or ointments on incision. Activity Walk each and every day, increasing distance each day. No lifting greater than 5 lbs.  Avoid excessive neck motions.. No driving or riding in car until further notice at follow up appointment. If provided with neck brace, wear at all times unless instructed otherwise. Diet Resume your normal diet.  Return to Work Will be discussed at you follow up appointment. Call Your Doctor If Any of These Occur Redness, drainage, or swelling at the wound.  Temperature greater than 101 degrees. Severe pain not relieved by pain medication. Incision starts to come apart. Increased difficulty swallowing. Follow Up Appt Call today for appointment in 1-2 weeks [phone]) or for problems.  If you have any hardware placed in your spine, you will need an x-ray before your appointment.

## 2014-01-31 NOTE — Plan of Care (Signed)
Problem: Consults Goal: Spinal Surgery Patient Education See Patient Education Module for education specifics.  Outcome: Completed/Met Date Met:  01/31/14     

## 2014-01-31 NOTE — Progress Notes (Signed)
Patient alert and oriented, mae's well, voiding adequate amount of urine, swallowing without difficulty, no c/o pain. Patient discharged home with family. Discharged instructions given to patient. Patient and family stated understanding of d/c instructions given and has an appointment with MD. Aisha Duncan Alejandro RN  

## 2014-01-31 NOTE — Transfer of Care (Signed)
Immediate Anesthesia Transfer of Care Note  Patient: Scott Jefferson  Procedure(s) Performed: Procedure(s): ANTERIOR CERVICAL DECOMPRESSION/DISCECTOMY FUSION CERVICAL THREE-FOUR (N/A)  Patient Location: PACU  Anesthesia Type:General  Level of Consciousness: awake, alert  and oriented  Airway & Oxygen Therapy: Patient Spontanous Breathing and Patient connected to nasal cannula oxygen  Post-op Assessment: Report given to PACU RN and Post -op Vital signs reviewed and stable  Post vital signs: Reviewed and stable  Complications: No apparent anesthesia complications

## 2014-01-31 NOTE — H&P (Signed)
Scott Jefferson is an 58 y.o. male.   Chief Complaint: Neck and shoulder pain HPI: The patient is a 58 year old gentleman who is here for evaluation neck pain with radiation towards the shoulders of more than a years duration. It is worse on the right than the left with no inciting event. Things are getting steadily worse. He was seen in the [facility] we did physical therapy and take anti-inflammatory medications muscle relaxants and pain medications all without relief. An MRI scan was done he now comes for evaluation. He denies any pain further down the arms. This week up at night with numb hands past that he had carpal tunnel. After evaluation in the office his MRI scan was reviewed which showed a very large disc herniation with marked spinal cord compression particular towards the right side. After discussing the options the patient requested surgery now comes for a C3-4 anterior cervical discectomy with fusion and plating. I had a long discussion with him regarding the risks and benefits of surgical intervention. The risks discussed include but are not limited to bleeding infection weakness some as paralysis spinal fluid leak coma quadriplegia hoarseness and death. We have discussed alternative methods of therapy although risks and benefits of nonintervention. [name] is had the opportunity to rest numerous questions and appears to understand. With this information in hand he has requested we proceed with surgery.  Past Medical History  Diagnosis Date  . Hyperlipidemia   . Dysrhythmia     a-fib  . COPD (chronic obstructive pulmonary disease)   . Shortness of breath dyspnea     comes and goes  . GERD (gastroesophageal reflux disease)   . Arthritis   . Cancer     basal cell skin cancer    Past Surgical History  Procedure Laterality Date  . Hip surgery    . Shoulder surgery    . Tonsillectomy    . Total knee arthroplasty Bilateral     2011 and 2012    Family History  Problem  Relation Age of Onset  . Alzheimer's disease Mother   . Cancer Mother   . Cancer Father    Social History:  reports that he quit smoking about 22 years ago. He does not have any smokeless tobacco history on file. He reports that he drinks alcohol. He reports that he does not use illicit drugs.  Allergies:  Allergies  Allergen Reactions  . Latex Other (See Comments)    Blisters     Medications Prior to Admission  Medication Sig Dispense Refill  . albuterol (PROVENTIL HFA;VENTOLIN HFA) 108 (90 BASE) MCG/ACT inhaler Inhale 1-2 puffs into the lungs every 6 (six) hours as needed for wheezing or shortness of breath.    . ALPRAZolam (XANAX) 0.5 MG tablet Take 1 tablet (0.5 mg total) by mouth 2 (two) times daily. (Patient not taking: Reported on 12/30/2013) 60 tablet 3  . aspirin 325 MG EC tablet Take 325 mg by mouth daily.    Marland Kitchen aspirin 81 MG tablet Take 81 mg by mouth daily.    Marland Kitchen atorvastatin (LIPITOR) 40 MG tablet Take 40 mg by mouth daily.    . cyclobenzaprine (FLEXERIL) 10 MG tablet Take 10 mg by mouth 2 (two) times daily as needed for muscle spasms.    . fenofibrate (TRICOR) 145 MG tablet Take 145 mg by mouth daily.    Marland Kitchen HYDROcodone-acetaminophen (LORTAB) 10-500 MG per tablet Take 1 tablet by mouth every 8 (eight) hours as needed for pain. (Patient not  taking: Reported on 12/30/2013) 90 tablet 1  . methadone (DOLOPHINE) 10 MG tablet Take 2 tablets (20 mg total) by mouth 3 (three) times daily. (Patient not taking: Reported on 12/30/2013) 180 tablet 0  . methadone (DOLOPHINE) 10 MG tablet Take 2 tablets (20 mg total) by mouth 3 (three) times daily. (Patient not taking: Reported on 12/30/2013) 180 tablet 0  . Multiple Vitamin (MULTIVITAMIN) tablet Take 1 tablet by mouth daily.    Marland Kitchen oxycodone (OXY-IR) 5 MG capsule Take 10 mg by mouth every 6 (six) hours.    Marland Kitchen PRESCRIPTION MEDICATION Apply 3 Squirts topically daily. Testosterone Gel    . RABEprazole (ACIPHEX) 20 MG tablet Take 20 mg by mouth daily.     . rosuvastatin (CRESTOR) 20 MG tablet Take 20 mg by mouth daily.      No results found for this or any previous visit (from the past 48 hour(s)). No results found.  Positive for ear pain a regular pulse with atrial fibrillation high cholesterol asthma chronic cough  There were no vitals taken for this visit.  The patient is awake alert and oriented. He has no facial asymmetry. Reflexes are 1+ and equal. Strength is 5 over 5 and sensation is intact Assessment/Plan Impression is that of a large disc herniation at C3-4 with spinal cord compression. The plan is for a C3-4 anterior cervical discectomy with fusion and plating.  Faythe Ghee, MD 01/31/2014, 9:37 AM

## 2014-01-31 NOTE — Plan of Care (Signed)
Problem: Consults Goal: Diagnosis - Spinal Surgery Outcome: Completed/Met Date Met:  01/31/14 Cervical Spine Fusion

## 2014-02-03 ENCOUNTER — Encounter (HOSPITAL_COMMUNITY): Payer: Self-pay | Admitting: Neurosurgery

## 2016-08-23 IMAGING — CR DG CHEST 2V
2 series · 2 of 2 positions shown · non-contrast
Comparison: None.

CLINICAL DATA: Atrial fibrillation and dyspnea

EXAM:
CHEST  2 VIEW

[w chest pa]
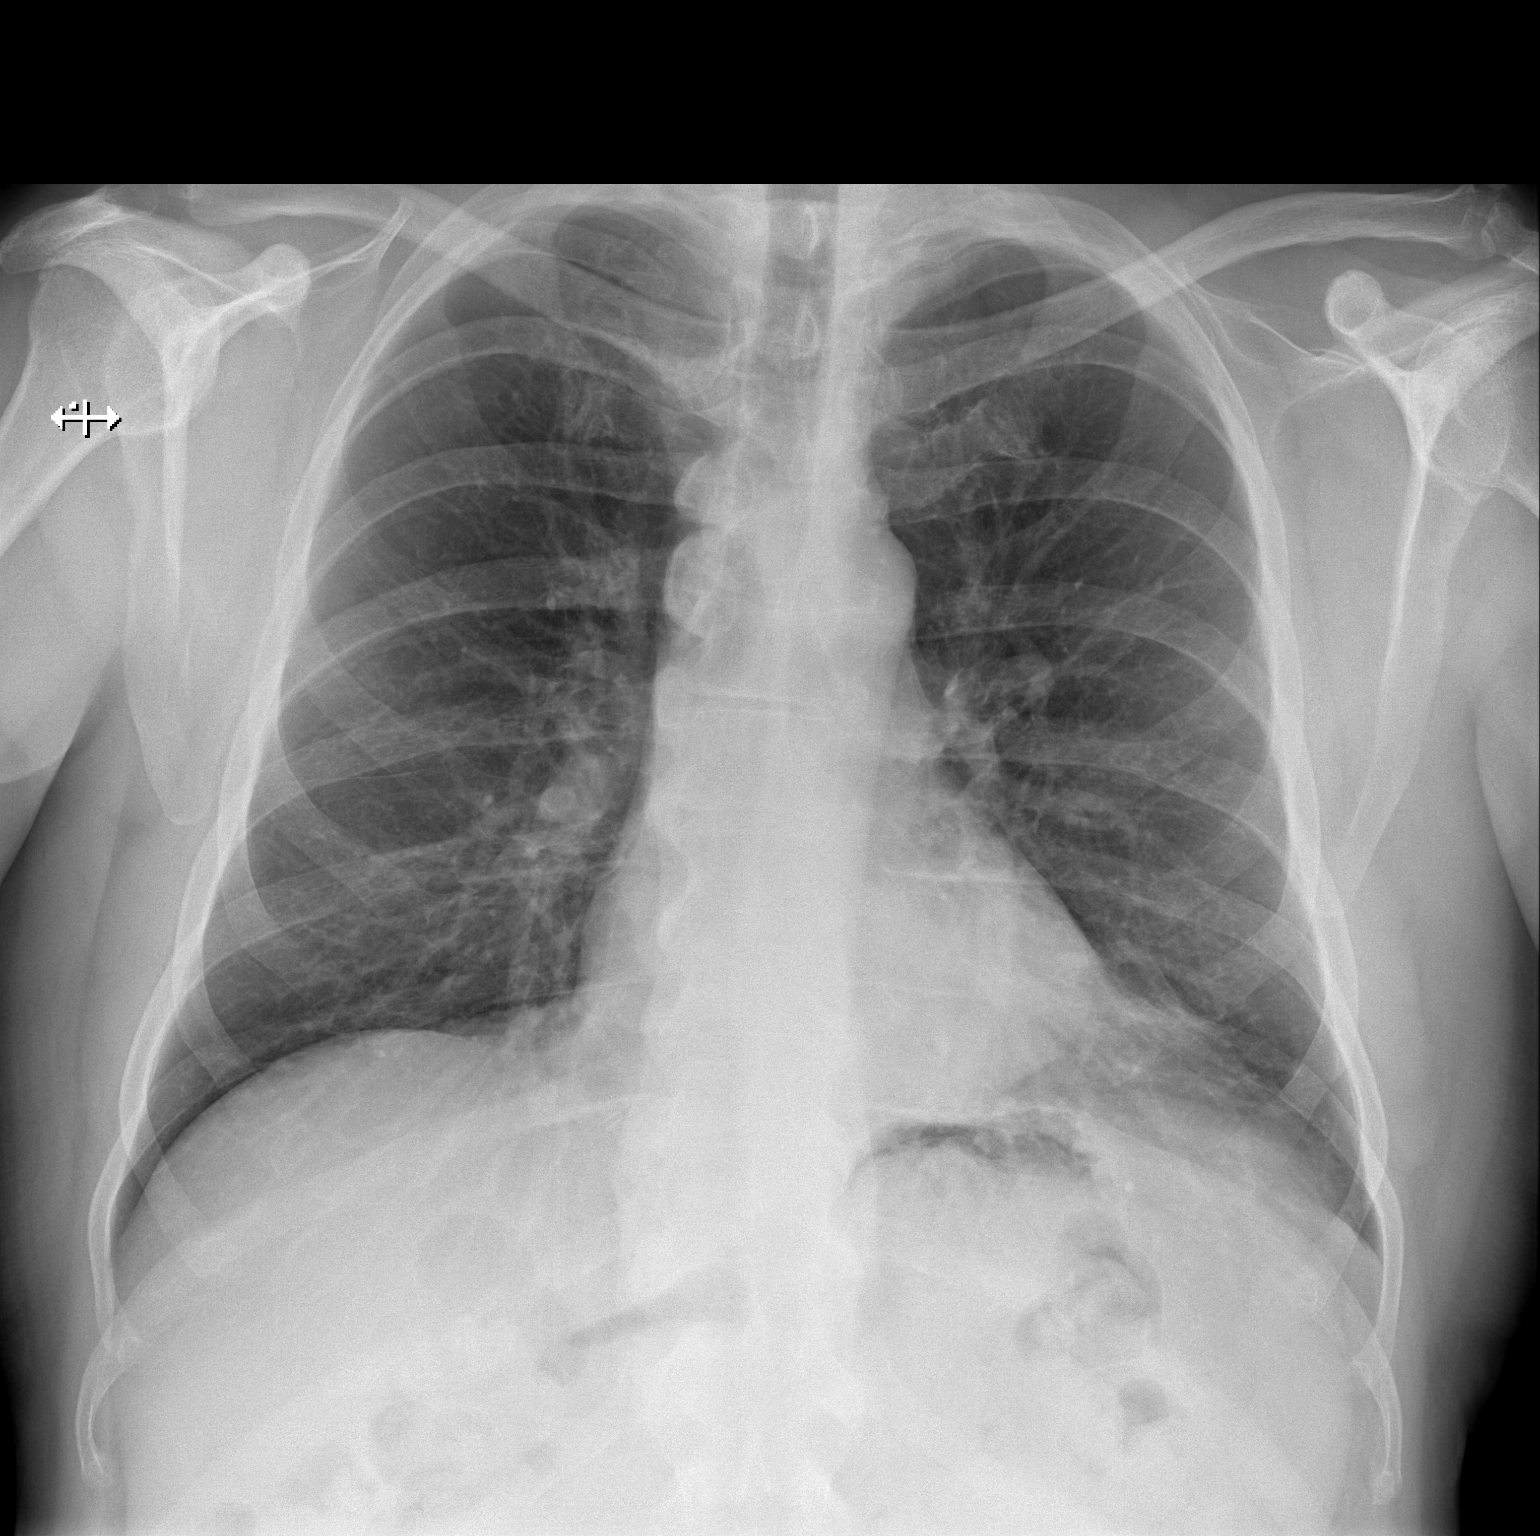

[w chest lat]
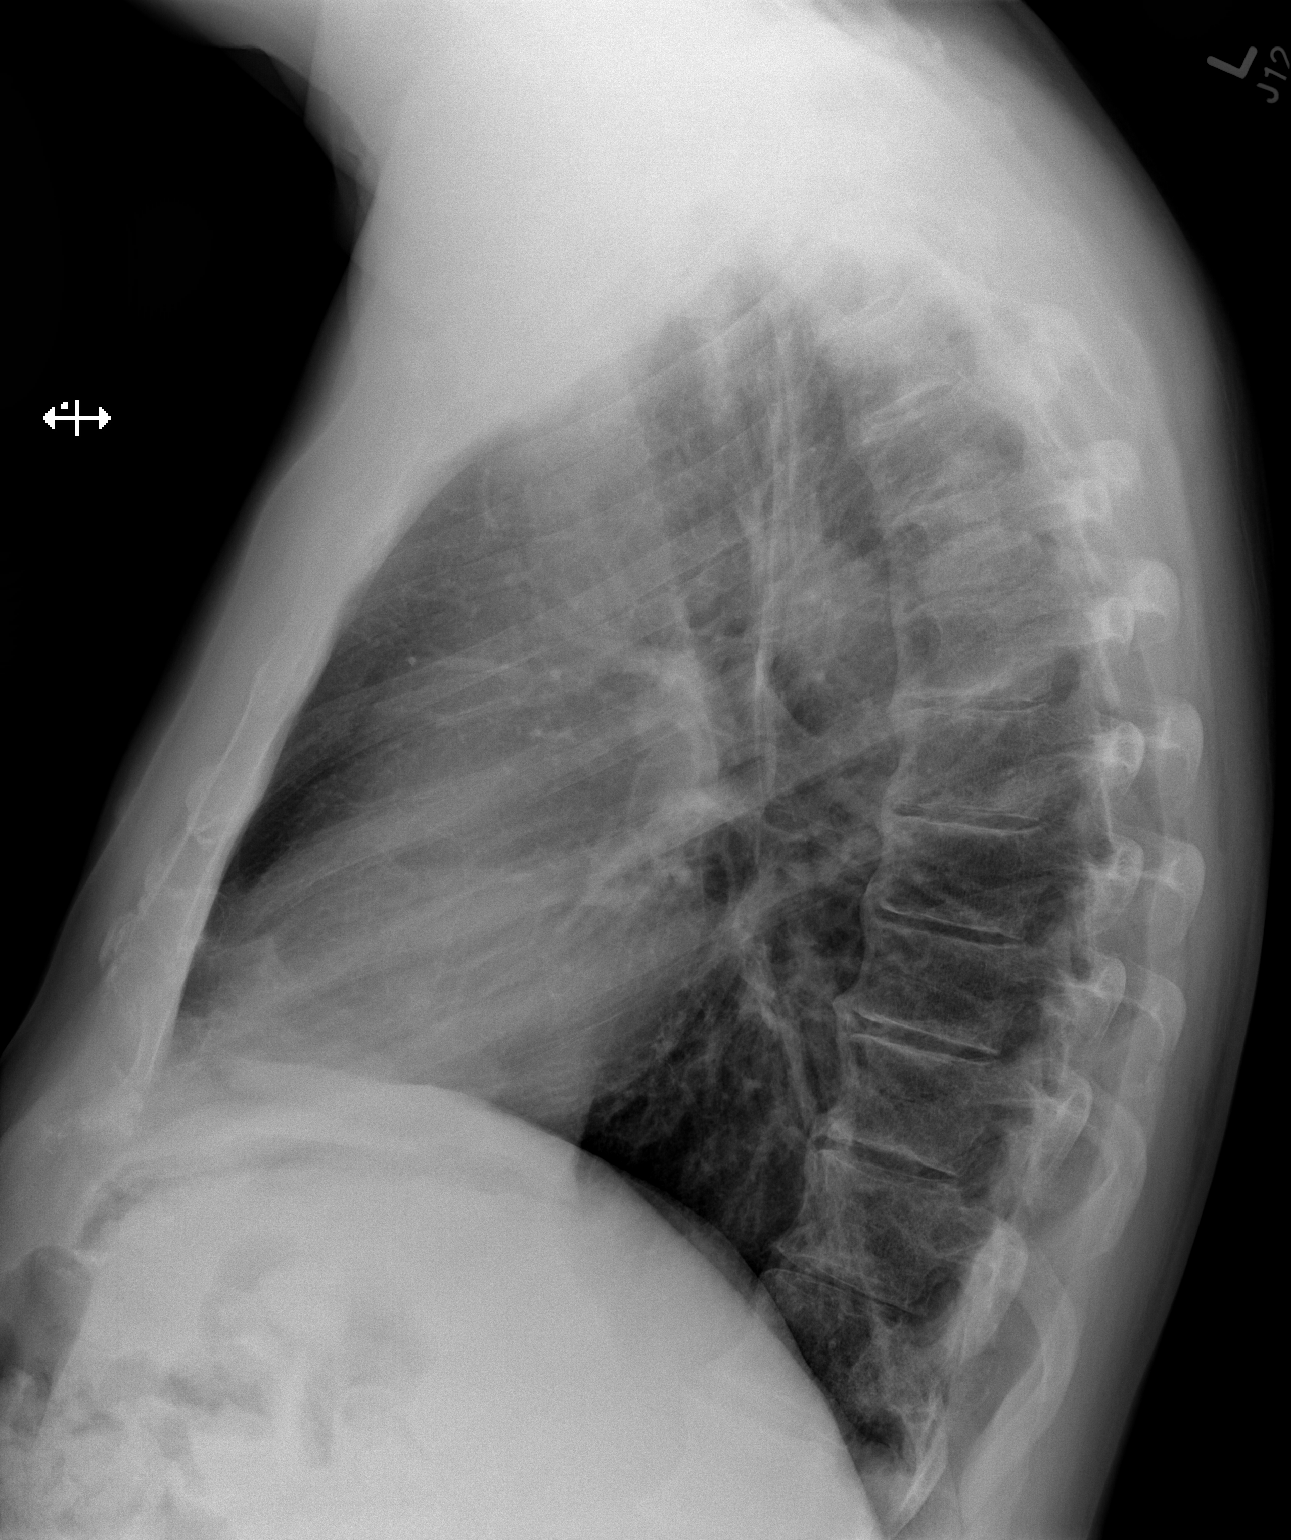

[2 of 2 positions shown; findings below may reference images not displayed]

FINDINGS: There is no edema or consolidation. There is minimal scarring in the
left base. Heart size and pulmonary vascularity are normal. No
adenopathy. There is degenerative change in the thoracic spine with
diffuse idiopathic skeletal hyperostosis.
IMPRESSION: Minimal scarring left base.  No edema or consolidation.
